# Patient Record
Sex: Male | Born: 1990 | Race: Black or African American | Hispanic: No | Marital: Single | Smoking: Never smoker
Health system: Southern US, Community
[De-identification: ages and names within clinical notes are randomized; demographics above are authoritative.]

## PROBLEM LIST (undated history)

## (undated) DIAGNOSIS — I4892 Unspecified atrial flutter: Secondary | ICD-10-CM

## (undated) HISTORY — DX: Unspecified atrial flutter: I48.92

---
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
## 2010-02-16 IMAGING — CR DG WRIST COMPLETE 3+V*R*
3 series · 3 of 3 positions shown · non-contrast
Comparison: None

CLINICAL DATA: Injured playing basketball

RIGHT WRIST - COMPLETE 3+ VIEW

[view not recorded (1 of 3)]
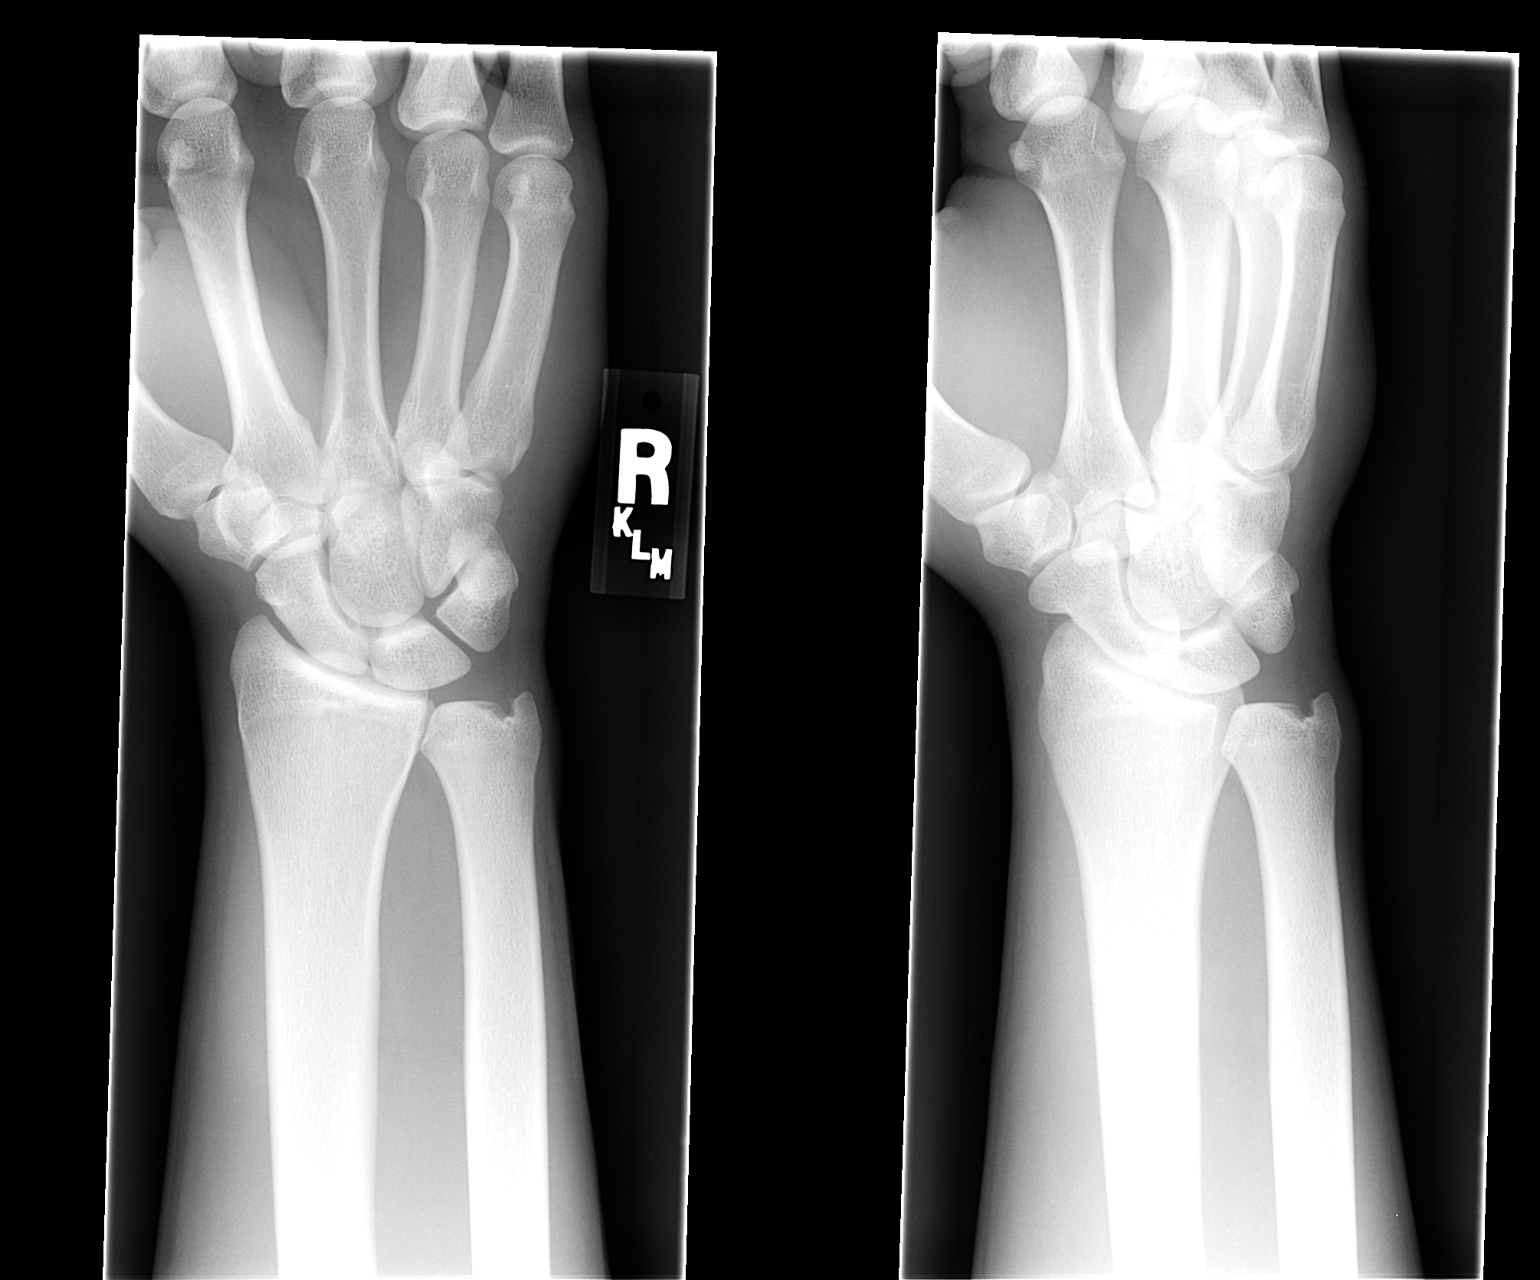

[view not recorded (2 of 3)]
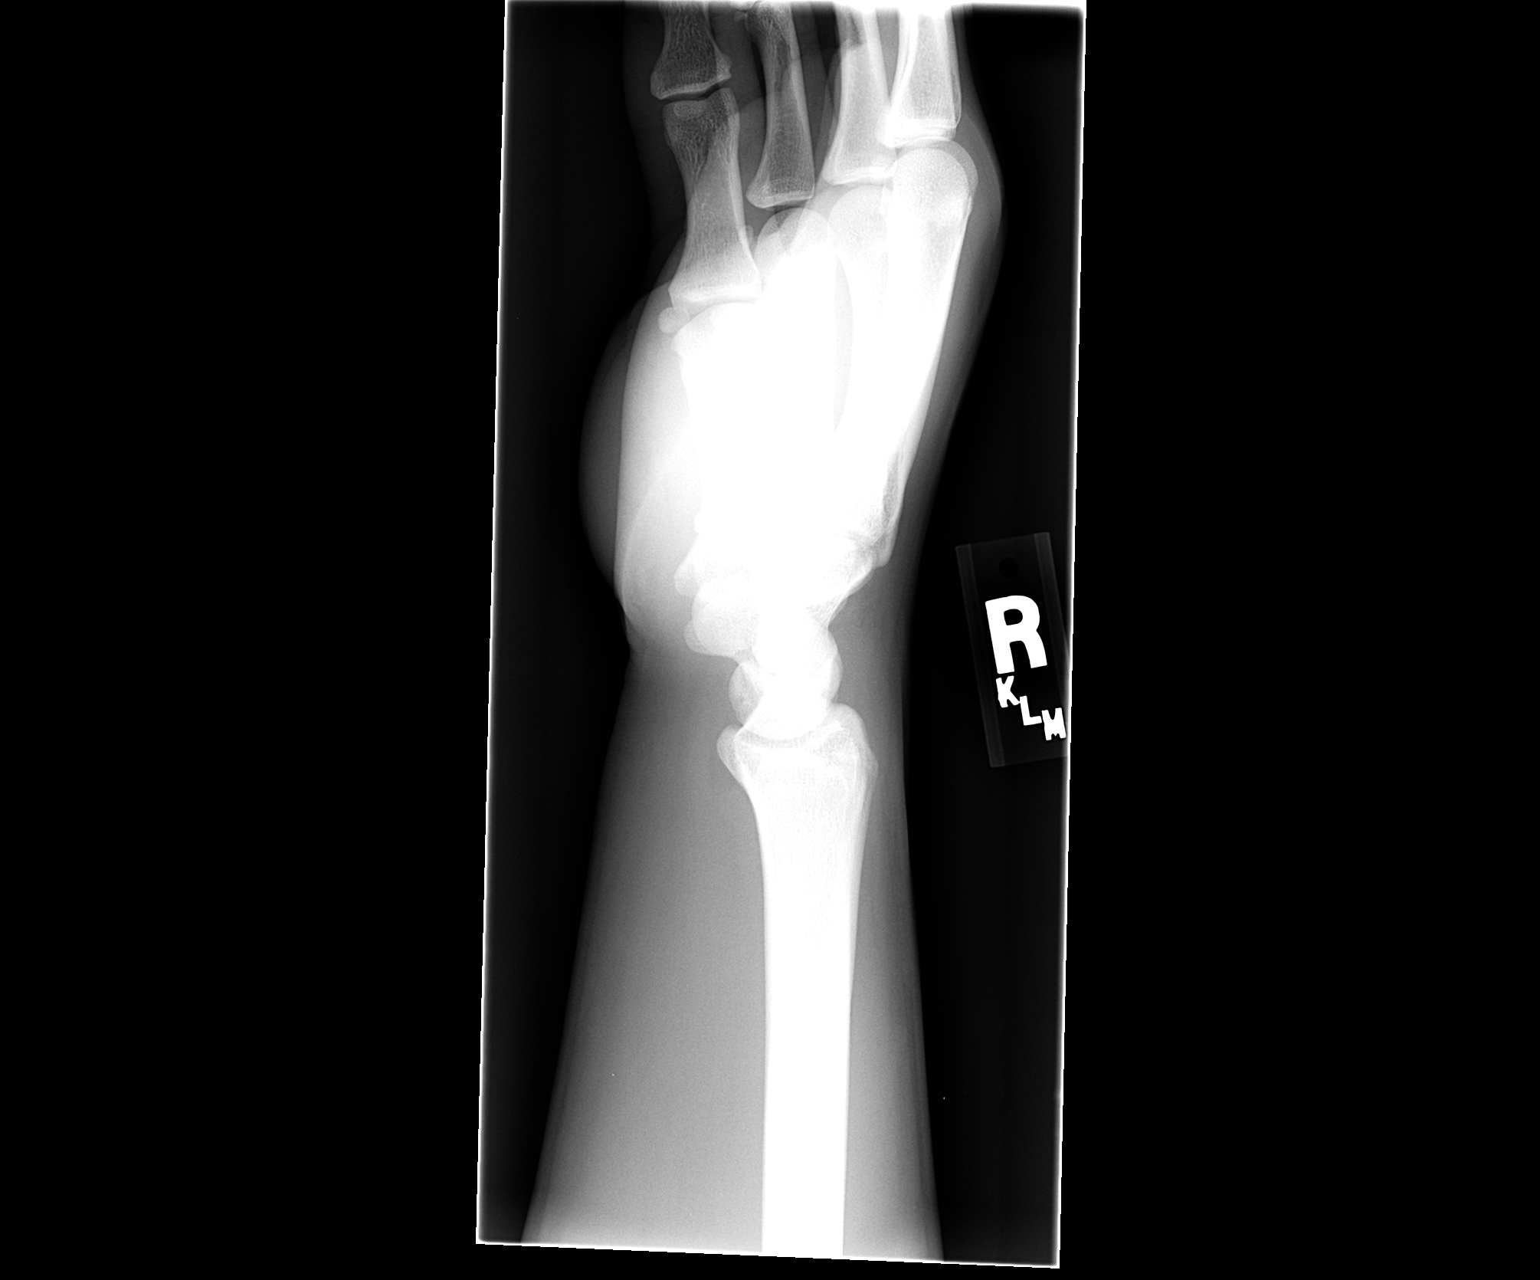

[view not recorded (3 of 3)]
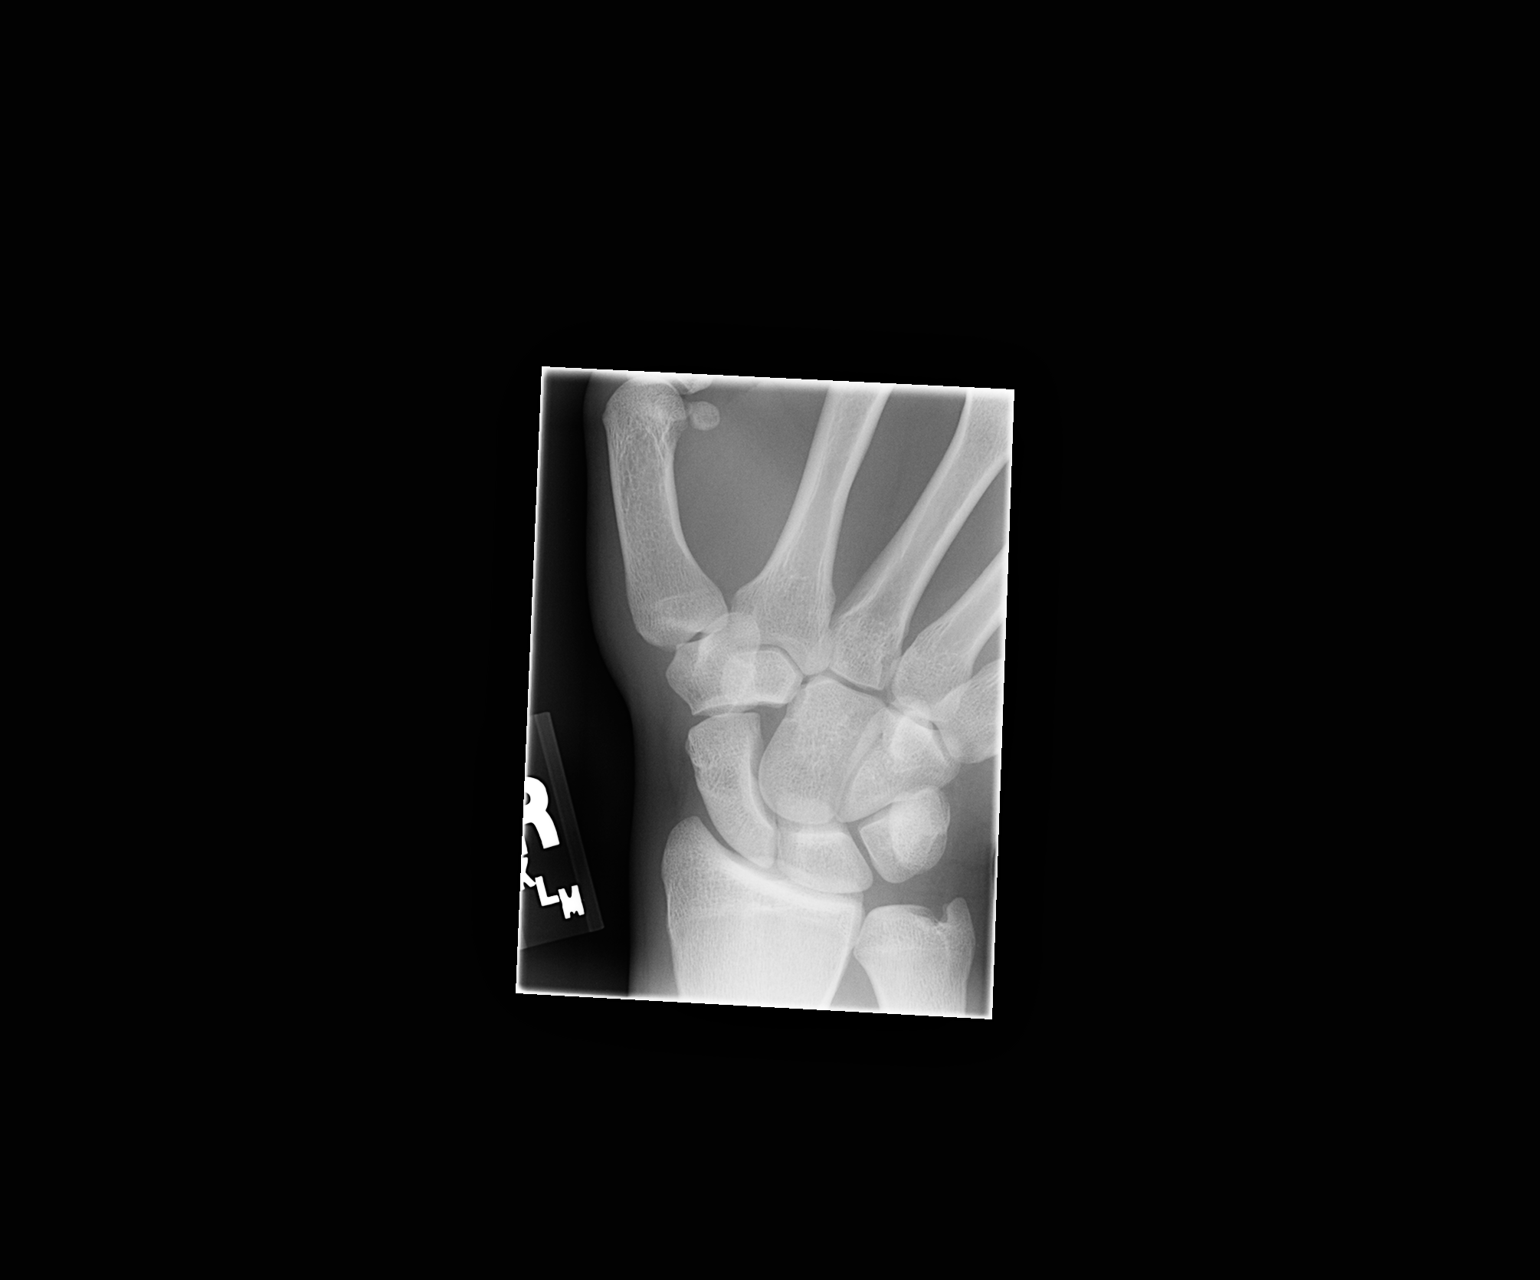

[3 of 3 positions shown; findings below may reference images not displayed]

FINDINGS: The radiocarpal joint space appears normal.  The carpal
bones are in normal position.  No acute bony abnormality is seen.
IMPRESSION: Negative right wrist.

## 2017-08-28 ENCOUNTER — Emergency Department (HOSPITAL_BASED_OUTPATIENT_CLINIC_OR_DEPARTMENT_OTHER)
Admission: EM | Admit: 2017-08-28 | Discharge: 2017-08-28 | Disposition: A | Discharge status: Home / Self Care | Payer: Self-pay | Attending: Emergency Medicine | Admitting: Emergency Medicine

## 2017-08-28 ENCOUNTER — Other Ambulatory Visit: Payer: Self-pay

## 2017-08-28 ENCOUNTER — Encounter (HOSPITAL_BASED_OUTPATIENT_CLINIC_OR_DEPARTMENT_OTHER): Payer: Self-pay | Admitting: *Deleted

## 2017-08-28 DIAGNOSIS — R05 Cough: Secondary | ICD-10-CM | POA: Insufficient documentation

## 2017-08-28 DIAGNOSIS — R509 Fever, unspecified: Secondary | ICD-10-CM | POA: Insufficient documentation

## 2017-08-28 DIAGNOSIS — R197 Diarrhea, unspecified: Secondary | ICD-10-CM | POA: Insufficient documentation

## 2017-08-28 DIAGNOSIS — R059 Cough, unspecified: Secondary | ICD-10-CM

## 2017-08-28 DIAGNOSIS — B349 Viral infection, unspecified: Secondary | ICD-10-CM | POA: Insufficient documentation

## 2017-08-28 DIAGNOSIS — R112 Nausea with vomiting, unspecified: Secondary | ICD-10-CM | POA: Insufficient documentation

## 2017-08-28 LAB — CBC
HEMATOCRIT: 49.7 % (ref 39.0–52.0)
HEMOGLOBIN: 17.2 g/dL — AB (ref 13.0–17.0)
MCH: 29.1 pg (ref 26.0–34.0)
MCHC: 34.6 g/dL (ref 30.0–36.0)
MCV: 84 fL (ref 78.0–100.0)
Platelets: 316 10*3/uL (ref 150–400)
RBC: 5.92 MIL/uL — ABNORMAL HIGH (ref 4.22–5.81)
RDW: 13 % (ref 11.5–15.5)
WBC: 8.1 10*3/uL (ref 4.0–10.5)

## 2017-08-28 LAB — URINALYSIS, ROUTINE W REFLEX MICROSCOPIC
BILIRUBIN URINE: NEGATIVE
Glucose, UA: NEGATIVE mg/dL
KETONES UR: 15 mg/dL — AB
LEUKOCYTES UA: NEGATIVE
NITRITE: NEGATIVE
PH: 6 (ref 5.0–8.0)
Protein, ur: NEGATIVE mg/dL
Specific Gravity, Urine: 1.025 (ref 1.005–1.030)

## 2017-08-28 LAB — COMPREHENSIVE METABOLIC PANEL
ALK PHOS: 99 U/L (ref 38–126)
ALT: 32 U/L (ref 17–63)
ANION GAP: 9 (ref 5–15)
AST: 29 U/L (ref 15–41)
Albumin: 4.6 g/dL (ref 3.5–5.0)
BILIRUBIN TOTAL: 0.9 mg/dL (ref 0.3–1.2)
BUN: 14 mg/dL (ref 6–20)
CALCIUM: 9.3 mg/dL (ref 8.9–10.3)
CO2: 24 mmol/L (ref 22–32)
Chloride: 99 mmol/L — ABNORMAL LOW (ref 101–111)
Creatinine, Ser: 1.34 mg/dL — ABNORMAL HIGH (ref 0.61–1.24)
GFR calc Af Amer: >60 mL/min (ref >60)
GLUCOSE: 101 mg/dL — AB (ref 65–99)
POTASSIUM: 3.5 mmol/L (ref 3.5–5.1)
Sodium: 132 mmol/L — ABNORMAL LOW (ref 135–145)
TOTAL PROTEIN: 8.5 g/dL — AB (ref 6.5–8.1)

## 2017-08-28 LAB — URINALYSIS, MICROSCOPIC (REFLEX): Squamous Epithelial / LPF: NONE SEEN

## 2017-08-28 LAB — LIPASE, BLOOD: Lipase: 22 U/L (ref 11–51)

## 2017-08-28 MED ORDER — SODIUM CHLORIDE 0.9 % IV BOLUS (SEPSIS)
1000.0000 mL | Freq: Once | INTRAVENOUS | Status: AC
  Administered 2017-08-28: 1000 mL via INTRAVENOUS

## 2017-08-28 MED ORDER — ONDANSETRON HCL 4 MG/2ML IJ SOLN
4.0000 mg | Freq: Once | INTRAMUSCULAR | Status: AC
  Administered 2017-08-28: 4 mg via INTRAVENOUS
  Filled 2017-08-28: qty 2

## 2017-08-28 MED ORDER — BENZONATATE 100 MG PO CAPS: 100.0000 mg | ORAL_CAPSULE | Freq: Three times a day (TID) | ORAL | 0 refills | Status: DC

## 2017-08-28 MED ORDER — ACETAMINOPHEN 500 MG PO TABS: 500.0000 mg | ORAL_TABLET | Freq: Four times a day (QID) | ORAL | 0 refills | Status: DC | PRN

## 2017-08-28 MED ORDER — ONDANSETRON 4 MG PO TBDP
4.0000 mg | ORAL_TABLET | Freq: Once | ORAL | Status: AC | PRN
  Administered 2017-08-28: 4 mg via ORAL

## 2017-08-28 MED ORDER — IBUPROFEN 600 MG PO TABS: 600.0000 mg | ORAL_TABLET | Freq: Four times a day (QID) | ORAL | 0 refills | Status: DC | PRN

## 2017-08-28 MED ORDER — ONDANSETRON HCL 4 MG PO TABS: 4.0000 mg | ORAL_TABLET | Freq: Four times a day (QID) | ORAL | 0 refills | Status: DC

## 2017-08-28 MED ORDER — ONDANSETRON 4 MG PO TBDP
ORAL_TABLET | ORAL | Status: AC
  Filled 2017-08-28: qty 1

## 2017-08-28 MED ORDER — ACETAMINOPHEN 325 MG PO TABS
650.0000 mg | ORAL_TABLET | Freq: Once | ORAL | Status: AC | PRN
  Administered 2017-08-28: 650 mg via ORAL
  Filled 2017-08-28: qty 2

## 2017-08-28 NOTE — Discharge Instructions (Signed)
Medications: Zofran, ibuprofen, Tylenol, Tessalon  Treatment: Take Zofran every 6 hours as needed for nausea or vomiting.  Take Tessalon every 8 hours as needed for cough.  Alternate ibuprofen and Tylenol as prescribed, as needed for fever or body aches.  Make sure to drink plenty of fluids.  Follow-up: Please return to the emergency department if you develop any new or worsening symptoms including intractable vomiting, localized abdominal pain, or difficulty breathing.  Please follow-up with a primary care provider for further evaluation of your kidney function once you are feeling better.  Your kidney function was decreased today, however this could be all related to your dehydration.  You can find a primary care provider by calling the number circled below.

## 2017-08-28 NOTE — ED Notes (Signed)
Pt given ginger ale and saltines per PA order.

## 2017-08-28 NOTE — ED Triage Notes (Signed)
Pt complains of fever, body aches, nausea, vomiting,body aches. started yesterday.

## 2017-08-28 NOTE — ED Notes (Signed)
Pt given d/c instructions as per chart. Rx x 4. Verbalizes understanding. No questions. 

## 2017-08-29 NOTE — ED Provider Notes (Signed)
[facility] EMERGENCY DEPARTMENT Provider Note   CSN: [id number] Arrival date & time: 08/28/17  1456     History   Chief Complaint Chief Complaint  Patient presents with  . Fever  . Nausea  . Emesis    HPI LEEVON Ruiz is a 27 y.o. male who presents with fever, body aches, nausea, vomiting, cough, and nasal congestion.  Patient has been not taking any medications at home for symptoms.  He has had a dry cough.  He denies any chest pain or shortness of breath.  He has not been able to keep any food or fluids down today.  He denies any abdominal pain, urinary symptoms.  No known sick contacts.  HPI  History reviewed. No pertinent past medical history.  There are no active problems to display for this patient.   History reviewed. No pertinent surgical history.     Home Medications    Prior to Admission medications   Medication Sig Start Date End Date Taking? Authorizing Provider  acetaminophen (TYLENOL) 500 MG tablet Take 1 tablet (500 mg total) by mouth every 6 (six) hours as needed. 08/28/17   Tylie Golonka, Waylan Boga, PA-C  benzonatate (TESSALON) 100 MG capsule Take 1 capsule (100 mg total) by mouth every 8 (eight) hours. 08/28/17   Nicky Milhouse, Waylan Boga, PA-C  ibuprofen (ADVIL,MOTRIN) 600 MG tablet Take 1 tablet (600 mg total) by mouth every 6 (six) hours as needed. 08/28/17   Kalisha Keadle, Waylan Boga, PA-C  ondansetron (ZOFRAN) 4 MG tablet Take 1 tablet (4 mg total) by mouth every 6 (six) hours. 08/28/17   Emi Holes, PA-C    Family History History reviewed. No pertinent family history.  Social History Social History   Tobacco Use  . Smoking status: Never Smoker  . Smokeless tobacco: Never Used  Substance Use Topics  . Alcohol use: Yes  . Drug use: Yes    Types: Marijuana     Allergies   Patient has no known allergies.   Review of Systems Review of Systems  Constitutional: Negative for chills and fever.  HENT: Positive for congestion and sore throat. Negative  for facial swelling.   Respiratory: Positive for cough. Negative for shortness of breath.   Cardiovascular: Negative for chest pain.  Gastrointestinal: Positive for nausea and vomiting. Negative for abdominal pain.  Genitourinary: Negative for dysuria.  Musculoskeletal: Positive for myalgias. Negative for back pain.  Skin: Negative for rash and wound.  Neurological: Positive for headaches.  Psychiatric/Behavioral: The patient is not nervous/anxious.      Physical Exam Updated Vital Signs BP 138/85   Pulse 89   Temp 99.6 F (37.6 C) (Oral)   Resp 16   SpO2 99%   Physical Exam  Constitutional: He appears well-developed and well-nourished. No distress.  HENT:  Head: Normocephalic and atraumatic.  Mouth/Throat: Oropharynx is clear and moist. No oropharyngeal exudate, posterior oropharyngeal edema, posterior oropharyngeal erythema or tonsillar abscesses.  Eyes: Conjunctivae are normal. Pupils are equal, round, and reactive to light. Right eye exhibits no discharge. Left eye exhibits no discharge. No scleral icterus.  Neck: Normal range of motion. Neck supple. No thyromegaly present.  Cardiovascular: Normal rate, regular rhythm, normal heart sounds and intact distal pulses. Exam reveals no gallop and no friction rub.  No murmur heard. Pulmonary/Chest: Effort normal and breath sounds normal. No stridor. No respiratory distress. He has no wheezes. He has no rales.  Abdominal: Soft. Bowel sounds are normal. He exhibits no distension. There is no tenderness.  There is no rebound and no guarding.  Musculoskeletal: He exhibits no edema.  Lymphadenopathy:    He has no cervical adenopathy.  Neurological: He is alert. Coordination normal.  Skin: Skin is warm and dry. No rash noted. He is not diaphoretic. No pallor.  Psychiatric: He has a normal mood and affect.  Nursing note and vitals reviewed.    ED Treatments / Results  Labs (all labs ordered are listed, but only abnormal results are  displayed) Labs Reviewed  COMPREHENSIVE METABOLIC PANEL - Abnormal; Notable for the following components:      Result Value   Sodium 132 (*)    Chloride 99 (*)    Glucose, Bld 101 (*)    Creatinine, Ser 1.34 (*)    Total Protein 8.5 (*)    All other components within normal limits  CBC - Abnormal; Notable for the following components:   RBC 5.92 (*)    Hemoglobin 17.2 (*)    All other components within normal limits  URINALYSIS, ROUTINE W REFLEX MICROSCOPIC - Abnormal; Notable for the following components:   Hgb urine dipstick TRACE (*)    Ketones, ur 15 (*)    All other components within normal limits  URINALYSIS, MICROSCOPIC (REFLEX) - Abnormal; Notable for the following components:   Bacteria, UA MANY (*)    All other components within normal limits  LIPASE, BLOOD    EKG  EKG Interpretation None       Radiology No results found.  Procedures Procedures (including critical care time)  Medications Ordered in ED Medications  acetaminophen (TYLENOL) tablet 650 mg (650 mg Oral Given 08/28/17 1545)  ondansetron (ZOFRAN-ODT) disintegrating tablet 4 mg (4 mg Oral Given 08/28/17 1545)  sodium chloride 0.9 % bolus 1,000 mL (0 mLs Intravenous Stopped 08/28/17 1959)  ondansetron (ZOFRAN) injection 4 mg (4 mg Intravenous Given 08/28/17 1856)     Initial Impression / Assessment and Plan / ED Course  I have reviewed the triage vital signs and the nursing notes.  Pertinent labs & imaging results that were available during my care of the patient were reviewed by me and considered in my medical decision making (see chart for details).     Patient with symptoms consistent with flu or other viral syndrome.  Labs show dehydration, hemoglobin 17.2, creatinine 1.34 (no previous to compare), 15 ketones in the urine.  Abdominal exam is benign, no focal tenderness.  Patient given IV fluids, Zofran, Tylenol in the ED and is feeling much better.  He is tolerating PO prior to discharge.  Will  discharge patient home with symptomatic treatment including Zofran, Tessalon, Tylenol, ibuprofen.  I discussed the cost versus benefits of Tamiflu and patient declines at this time.  Return precautions discussed.  Patient understands and agrees with plan.  Patient vitals stable and discharged in satisfactory condition.  Final Clinical Impressions(s) / ED Diagnoses   Final diagnoses:  Viral syndrome  Nausea vomiting and diarrhea  Cough    ED Discharge Orders        Ordered    ondansetron (ZOFRAN) 4 MG tablet  Every 6 hours     08/28/17 2015    acetaminophen (TYLENOL) 500 MG tablet  Every 6 hours PRN     08/28/17 2015    ibuprofen (ADVIL,MOTRIN) 600 MG tablet  Every 6 hours PRN     08/28/17 2015    benzonatate (TESSALON) 100 MG capsule  Every 8 hours     08/28/17 2015  Emi Holes, PA-C 08/29/17 0126    Tilden Fossa, MD 08/29/17 1438

## 2020-03-11 ENCOUNTER — Encounter (HOSPITAL_BASED_OUTPATIENT_CLINIC_OR_DEPARTMENT_OTHER): Payer: Self-pay | Admitting: *Deleted

## 2020-03-11 ENCOUNTER — Other Ambulatory Visit: Payer: Self-pay

## 2020-03-11 ENCOUNTER — Emergency Department (HOSPITAL_BASED_OUTPATIENT_CLINIC_OR_DEPARTMENT_OTHER)
Admission: EM | Admit: 2020-03-11 | Discharge: 2020-03-11 | Disposition: A | Discharge status: Home / Self Care | Payer: Self-pay | Attending: Emergency Medicine | Admitting: Emergency Medicine

## 2020-03-11 DIAGNOSIS — Z202 Contact with and (suspected) exposure to infections with a predominantly sexual mode of transmission: Secondary | ICD-10-CM | POA: Insufficient documentation

## 2020-03-11 LAB — URINALYSIS, MICROSCOPIC (REFLEX)

## 2020-03-11 LAB — URINALYSIS, ROUTINE W REFLEX MICROSCOPIC
Bilirubin Urine: NEGATIVE
Glucose, UA: NEGATIVE mg/dL
Hgb urine dipstick: NEGATIVE
Ketones, ur: NEGATIVE mg/dL
Nitrite: NEGATIVE
Protein, ur: NEGATIVE mg/dL
Specific Gravity, Urine: 1.025 (ref 1.005–1.030)
pH: 6 (ref 5.0–8.0)

## 2020-03-11 LAB — HIV ANTIBODY (ROUTINE TESTING W REFLEX): HIV Screen 4th Generation wRfx: NONREACTIVE

## 2020-03-11 MED ORDER — DOXYCYCLINE HYCLATE 100 MG PO TABS
100.0000 mg | ORAL_TABLET | Freq: Once | ORAL | Status: AC
  Administered 2020-03-11: 16:00:00 100 mg via ORAL
  Filled 2020-03-11: qty 1

## 2020-03-11 MED ORDER — LIDOCAINE HCL (PF) 1 % IJ SOLN
INTRAMUSCULAR | Status: AC
  Filled 2020-03-11: qty 5

## 2020-03-11 MED ORDER — CEFTRIAXONE SODIUM 500 MG IJ SOLR
500.0000 mg | Freq: Once | INTRAMUSCULAR | Status: AC
  Administered 2020-03-11: 16:00:00 500 mg via INTRAMUSCULAR
  Filled 2020-03-11: qty 500

## 2020-03-11 MED ORDER — DOXYCYCLINE HYCLATE 100 MG PO CAPS: 100.0000 mg | ORAL_CAPSULE | Freq: Two times a day (BID) | ORAL | 0 refills | Status: AC

## 2020-03-11 NOTE — ED Notes (Signed)
Medicated per orders, reviewed AVS with pt , provided copy of AVS as well, discussed safe sex practices with client and offered opportunity for questions, also further signs and symptoms to watch for. Instructed to get Rx filled and take as per ordered and to complete medication as ordered.

## 2020-03-11 NOTE — ED Notes (Signed)
States his male partner was recently dx with Chlamydia and is here for examination and testing. States he has no noted any signs and symptoms

## 2020-03-11 NOTE — Discharge Instructions (Addendum)
Please read the instructions below.  Take the antibiotic, as directed, until gone. Talk with your primary care provider about any new medications.  You have been treated today for gonorrhea and chlamydia. You will receive a call from the hospital if your test results come back positive. Avoid sexual activity until you know your test results. If your results come back positive, it is important that you inform all of your sexual partners.  Return to the ER for new or worsening symptoms.

## 2020-03-11 NOTE — ED Triage Notes (Signed)
C/o exposed to chlamydia, denies discharge

## 2020-03-11 NOTE — ED Provider Notes (Signed)
[facility] EMERGENCY DEPARTMENT Provider Note   CSN: [id number] Arrival date & time: 03/11/20  1329     History Chief Complaint  Patient presents with  . Exposure to STD    Donald Ruiz is a 29 y.o. male presenting to the ED for exposure to chlamydia.  His male partner is here in the ED for treatment of chlamydia after recent positive test result.  He states he has had no symptoms including no penile discharge, dysuria, testicular pain or swelling, abdominal pain, pain with bowel movements, fevers or chills.  The history is provided by the patient.       History reviewed. No pertinent past medical history.  There are no problems to display for this patient.   History reviewed. No pertinent surgical history.     History reviewed. No pertinent family history.  Social History   Tobacco Use  . Smoking status: Never Smoker  . Smokeless tobacco: Never Used  Substance Use Topics  . Alcohol use: Yes  . Drug use: Yes    Types: Marijuana    Home Medications Prior to Admission medications   Medication Sig Start Date End Date Taking? Authorizing Provider  acetaminophen (TYLENOL) 500 MG tablet Take 1 tablet (500 mg total) by mouth every 6 (six) hours as needed. 08/28/17   Law, Waylan Boga, PA-C  benzonatate (TESSALON) 100 MG capsule Take 1 capsule (100 mg total) by mouth every 8 (eight) hours. 08/28/17   Law, Waylan Boga, PA-C  doxycycline (VIBRAMYCIN) 100 MG capsule Take 1 capsule (100 mg total) by mouth 2 (two) times daily for 7 days. 03/11/20 03/18/20  Parag Dorton, [place] N, PA-C  ibuprofen (ADVIL,MOTRIN) 600 MG tablet Take 1 tablet (600 mg total) by mouth every 6 (six) hours as needed. 08/28/17   Law, Waylan Boga, PA-C  ondansetron (ZOFRAN) 4 MG tablet Take 1 tablet (4 mg total) by mouth every 6 (six) hours. 08/28/17   Emi Holes, PA-C    Allergies    Patient has no known allergies.  Review of Systems   Review of Systems  All other systems reviewed and are  negative.   Physical Exam Updated Vital Signs BP 121/80   Pulse 75   Temp 98.2 F (36.8 C) (Oral)   Resp 18   Ht 6\' 6"  (1.981 m)   Wt 117.9 kg   SpO2 100%   BMI 30.05 kg/m   Physical Exam Vitals and nursing note reviewed.  Constitutional:      Appearance: He is well-developed.  HENT:     Head: Normocephalic and atraumatic.  Eyes:     Conjunctiva/sclera: Conjunctivae normal.  Cardiovascular:     Rate and Rhythm: Normal rate and regular rhythm.  Pulmonary:     Effort: Pulmonary effort is normal.     Breath sounds: Normal breath sounds.  Abdominal:     General: Bowel sounds are normal.     Tenderness: There is no abdominal tenderness.  Genitourinary:    Comments: Patient declined Neurological:     Mental Status: He is alert.  Psychiatric:        Mood and Affect: Mood normal.        Behavior: Behavior normal.     ED Results / Procedures / Treatments   Labs (all labs ordered are listed, but only abnormal results are displayed) Labs Reviewed  URINALYSIS, ROUTINE W REFLEX MICROSCOPIC - Abnormal; Notable for the following components:      Result Value   Leukocytes,Ua TRACE (*)  All other components within normal limits  URINALYSIS, MICROSCOPIC (REFLEX) - Abnormal; Notable for the following components:   Bacteria, UA MANY (*)    All other components within normal limits  HIV ANTIBODY (ROUTINE TESTING W REFLEX)  RPR  GC/CHLAMYDIA PROBE AMP ([place]) NOT AT [facility]    EKG None  Radiology No results found.  Procedures Procedures (including critical care time)  Medications Ordered in ED Medications  lidocaine (PF) (XYLOCAINE) 1 % injection (has no administration in time range)  cefTRIAXone (ROCEPHIN) injection 500 mg (500 mg Intramuscular Given 03/11/20 1604)  doxycycline (VIBRA-TABS) tablet 100 mg (100 mg Oral Given 03/11/20 1604)    ED Course  I have reviewed the triage vital signs and the nursing notes.  Pertinent labs & imaging results that were  available during my care of the patient were reviewed by me and considered in my medical decision making (see chart for details).    MDM Rules/Calculators/A&P                          Patient presenting for chlamydia treatment after a male partner recently tested positive.  He denies any symptoms or complaints at this time.  Declined GU exam though abdomen is benign.  Vital signs are normal.  Urine with bacteria, patient without urinary symptoms.  GC chlamydia sent, HIV, RPR sent.  Patient is treated in the ED with Rocephin and doxycycline.  He is prescribed doxycycline for chlamydia treatment.  Instructed to follow-up with the health department.  Instructed to avoid sexual activity until he is completed treatment and had retesting.  Return precautions discussed.  Patient discharged.  Final Clinical Impression(s) / ED Diagnoses Final diagnoses:  Exposure to chlamydia    Rx / DC Orders ED Discharge Orders         Ordered    doxycycline (VIBRAMYCIN) 100 MG capsule  2 times daily     Discontinue  Reprint     03/11/20 1552           Braylee Lal, [place] N, PA-C 03/11/20 1612    Benjiman Core, MD 03/11/20 2340

## 2020-03-12 LAB — RPR: RPR Ser Ql: NONREACTIVE

## 2023-11-18 ENCOUNTER — Emergency Department (HOSPITAL_COMMUNITY): Payer: Self-pay

## 2023-11-18 ENCOUNTER — Encounter (HOSPITAL_COMMUNITY): Payer: Self-pay | Admitting: Emergency Medicine

## 2023-11-18 ENCOUNTER — Inpatient Hospital Stay (HOSPITAL_COMMUNITY)
Admission: EM | Admit: 2023-11-18 | Discharge: 2023-11-20 | DRG: 309 | Disposition: A | Discharge status: Home / Self Care | Payer: Self-pay | Attending: Family Medicine | Admitting: Family Medicine

## 2023-11-18 ENCOUNTER — Other Ambulatory Visit: Payer: Self-pay

## 2023-11-18 DIAGNOSIS — R072 Precordial pain: Principal | ICD-10-CM

## 2023-11-18 DIAGNOSIS — R079 Chest pain, unspecified: Secondary | ICD-10-CM | POA: Diagnosis present

## 2023-11-18 DIAGNOSIS — F1290 Cannabis use, unspecified, uncomplicated: Secondary | ICD-10-CM | POA: Diagnosis present

## 2023-11-18 DIAGNOSIS — Z79899 Other long term (current) drug therapy: Secondary | ICD-10-CM

## 2023-11-18 DIAGNOSIS — N179 Acute kidney failure, unspecified: Secondary | ICD-10-CM | POA: Diagnosis present

## 2023-11-18 DIAGNOSIS — R739 Hyperglycemia, unspecified: Secondary | ICD-10-CM | POA: Diagnosis present

## 2023-11-18 DIAGNOSIS — E860 Dehydration: Secondary | ICD-10-CM | POA: Diagnosis present

## 2023-11-18 DIAGNOSIS — R7309 Other abnormal glucose: Secondary | ICD-10-CM | POA: Insufficient documentation

## 2023-11-18 DIAGNOSIS — R7989 Other specified abnormal findings of blood chemistry: Secondary | ICD-10-CM | POA: Insufficient documentation

## 2023-11-18 DIAGNOSIS — I4892 Unspecified atrial flutter: Principal | ICD-10-CM

## 2023-11-18 LAB — RAPID URINE DRUG SCREEN, HOSP PERFORMED
Amphetamines: NOT DETECTED
Barbiturates: NOT DETECTED
Benzodiazepines: NOT DETECTED
Cocaine: NOT DETECTED
Opiates: NOT DETECTED
Tetrahydrocannabinol: POSITIVE — AB

## 2023-11-18 LAB — COMPREHENSIVE METABOLIC PANEL WITH GFR
ALT: 31 U/L (ref 0–44)
AST: 37 U/L (ref 15–41)
Albumin: 4.1 g/dL (ref 3.5–5.0)
Alkaline Phosphatase: 71 U/L (ref 38–126)
Anion gap: 15 (ref 5–15)
BUN: 17 mg/dL (ref 6–20)
CO2: 20 mmol/L — ABNORMAL LOW (ref 22–32)
Calcium: 9.2 mg/dL (ref 8.9–10.3)
Chloride: 102 mmol/L (ref 98–111)
Creatinine, Ser: 1.88 mg/dL — ABNORMAL HIGH (ref 0.61–1.24)
GFR, Estimated: 48 mL/min — ABNORMAL LOW (ref >60)
Glucose, Bld: 233 mg/dL — ABNORMAL HIGH (ref 70–99)
Potassium: 4 mmol/L (ref 3.5–5.1)
Sodium: 137 mmol/L (ref 135–145)
Total Bilirubin: 0.9 mg/dL (ref 0.0–1.2)
Total Protein: 7.5 g/dL (ref 6.5–8.1)

## 2023-11-18 LAB — CBC
HCT: 49 % (ref 39.0–52.0)
Hemoglobin: 16.3 g/dL (ref 13.0–17.0)
MCH: 28.9 pg (ref 26.0–34.0)
MCHC: 33.3 g/dL (ref 30.0–36.0)
MCV: 86.9 fL (ref 80.0–100.0)
Platelets: 407 K/uL — ABNORMAL HIGH (ref 150–400)
RBC: 5.64 MIL/uL (ref 4.22–5.81)
RDW: 13.1 % (ref 11.5–15.5)
WBC: 8.6 K/uL (ref 4.0–10.5)
nRBC: 0 % (ref 0.0–0.2)

## 2023-11-18 LAB — LIPID PANEL
Cholesterol: 134 mg/dL (ref 0–200)
HDL: 26 mg/dL — ABNORMAL LOW (ref >40)
LDL Cholesterol: 101 mg/dL — ABNORMAL HIGH (ref 0–99)
Total CHOL/HDL Ratio: 5.2 ratio
Triglycerides: 36 mg/dL (ref <150)
VLDL: 7 mg/dL (ref 0–40)

## 2023-11-18 LAB — HEMOGLOBIN A1C
Hgb A1c MFr Bld: 5.4 % (ref 4.8–5.6)
Mean Plasma Glucose: 108.28 mg/dL

## 2023-11-18 LAB — MAGNESIUM: Magnesium: 1.9 mg/dL (ref 1.7–2.4)

## 2023-11-18 LAB — TROPONIN I (HIGH SENSITIVITY)
Troponin I (High Sensitivity): 20 ng/L — ABNORMAL HIGH (ref <18)
Troponin I (High Sensitivity): 162 ng/L (ref <18)
Troponin I (High Sensitivity): 204 ng/L (ref <18)

## 2023-11-18 LAB — LIPASE, BLOOD: Lipase: 31 U/L (ref 11–51)

## 2023-11-18 LAB — BRAIN NATRIURETIC PEPTIDE: B Natriuretic Peptide: 51.9 pg/mL (ref 0.0–100.0)

## 2023-11-18 LAB — TSH: TSH: 3.475 u[IU]/mL (ref 0.350–4.500)

## 2023-11-18 LAB — HIV ANTIBODY (ROUTINE TESTING W REFLEX): HIV Screen 4th Generation wRfx: NONREACTIVE

## 2023-11-18 MED ORDER — FENTANYL CITRATE PF 50 MCG/ML IJ SOSY
50.0000 ug | PREFILLED_SYRINGE | Freq: Once | INTRAMUSCULAR | Status: AC
  Administered 2023-11-18: 50 ug via INTRAVENOUS
  Filled 2023-11-18: qty 1

## 2023-11-18 MED ORDER — SODIUM CHLORIDE 0.9 % IV BOLUS
1000.0000 mL | Freq: Once | INTRAVENOUS | Status: AC
  Administered 2023-11-18: 1000 mL via INTRAVENOUS

## 2023-11-18 MED ORDER — HEPARIN (PORCINE) 25000 UT/250ML-% IV SOLN
1400.0000 [IU]/h | INTRAVENOUS | Status: AC
  Administered 2023-11-18: 1400 [IU]/h via INTRAVENOUS
  Filled 2023-11-18: qty 250

## 2023-11-18 MED ORDER — POLYETHYLENE GLYCOL 3350 17 G PO PACK: 17.0000 g | PACK | Freq: Every day | ORAL | Status: DC | PRN

## 2023-11-18 MED ORDER — APIXABAN 5 MG PO TABS
5.0000 mg | ORAL_TABLET | Freq: Two times a day (BID) | ORAL | Status: DC
  Administered 2023-11-19 – 2023-11-20 (×3): 5 mg via ORAL
  Filled 2023-11-18 (×3): qty 1

## 2023-11-18 MED ORDER — METOPROLOL TARTRATE 5 MG/5ML IV SOLN
5.0000 mg | Freq: Once | INTRAVENOUS | Status: AC
  Administered 2023-11-18: 5 mg via INTRAVENOUS
  Filled 2023-11-18: qty 5

## 2023-11-18 MED ORDER — INSULIN ASPART 100 UNIT/ML IJ SOLN: 0.0000 [IU] | Freq: Three times a day (TID) | INTRAMUSCULAR | Status: DC

## 2023-11-18 MED ORDER — SODIUM CHLORIDE 0.9% FLUSH
3.0000 mL | Freq: Two times a day (BID) | INTRAVENOUS | Status: DC
  Administered 2023-11-18 – 2023-11-20 (×4): 3 mL via INTRAVENOUS

## 2023-11-18 MED ORDER — IOHEXOL 350 MG/ML SOLN
75.0000 mL | Freq: Once | INTRAVENOUS | Status: AC | PRN
  Administered 2023-11-18: 75 mL via INTRAVENOUS

## 2023-11-18 MED ORDER — ONDANSETRON HCL 4 MG/2ML IJ SOLN
4.0000 mg | Freq: Once | INTRAMUSCULAR | Status: AC
  Administered 2023-11-18: 4 mg via INTRAVENOUS
  Filled 2023-11-18: qty 2

## 2023-11-18 MED ORDER — ASPIRIN 81 MG PO CHEW
324.0000 mg | CHEWABLE_TABLET | Freq: Once | ORAL | Status: AC
  Administered 2023-11-18: 324 mg via ORAL
  Filled 2023-11-18: qty 4

## 2023-11-18 MED ORDER — HEPARIN BOLUS VIA INFUSION
4000.0000 [IU] | Freq: Once | INTRAVENOUS | Status: AC
  Administered 2023-11-18: 4000 [IU] via INTRAVENOUS
  Filled 2023-11-18: qty 4000

## 2023-11-18 MED ORDER — METOPROLOL TARTRATE 25 MG PO TABS
25.0000 mg | ORAL_TABLET | Freq: Two times a day (BID) | ORAL | Status: DC
  Administered 2023-11-18 – 2023-11-19 (×2): 25 mg via ORAL
  Filled 2023-11-18 (×2): qty 1

## 2023-11-18 MED ORDER — APIXABAN 5 MG PO TABS
10.0000 mg | ORAL_TABLET | Freq: Once | ORAL | Status: AC
  Administered 2023-11-18: 10 mg via ORAL
  Filled 2023-11-18: qty 2

## 2023-11-18 MED ORDER — ACETAMINOPHEN 325 MG PO TABS: 650.0000 mg | ORAL_TABLET | Freq: Four times a day (QID) | ORAL | Status: DC | PRN

## 2023-11-18 MED ORDER — ACETAMINOPHEN 650 MG RE SUPP: 650.0000 mg | Freq: Four times a day (QID) | RECTAL | Status: DC | PRN

## 2023-11-18 NOTE — H&P (Signed)
 History and Physical   Donald Ruiz DGU:[id number] DOB: 1991/03/05 DOA: 11/18/2023  PCP: Pricilla Riffle, MD   Patient coming from: Home  Chief Complaint: Chest Pain  HPI: Donald Ruiz is a 33 y.o. male with no known significant past medical history presenting with chest pain.  Patient was sitting watching TV and when he got up he began to experience lower chest and epigastric pain.  He noted to have significant shortness of breath as well.  Also reports associated lightheadedness and diaphoresis.  States he has had some prior less severe very brief episodes in the past but none that were as severe as this.   Denies fevers, chills, constipation, diarrhea, nausea, vomiting.  ED Course: Vital signs in the ED notable for heart rate in the 60s to 130s, blood pressure in the 100s to 110s systolic.  Lab workup included CMP with bicarb 28, creatinine of 1.8 last labs showed creatinine of 1.36 years ago, glucose newly elevated to 232.  CBC with platelets 4 7.  Troponin trend 20, 162.  Lipase normal.  BNP normal.  TSH normal.  UDS pending.  Chest x-ray without acute abnormality.  CTA PE study without acute abnormality.  CT of the abdomen pelvis without acute abnormality.  Patient received aspirin, fentanyl, metoprolol, Zofran, 1 L IV fluids and started on heparin infusion in the ED.  Cardiology consulted and will see the patient.  Asked for hospitalist to admit due to newly elevated glucose and creatinine.  Review of Systems: As per HPI otherwise all other systems reviewed and are negative.  History reviewed. No pertinent past medical history.  History reviewed. No pertinent surgical history.  Social History  reports that he has never smoked. He has never used smokeless tobacco. He reports current alcohol use. He reports current drug use. Drug: Marijuana.  No Known Allergies  History reviewed. No pertinent family history.  Prior to Admission medications   Medication Sig Start Date End  Date Taking? Authorizing Provider  acetaminophen (TYLENOL) 500 MG tablet Take 1 tablet (500 mg total) by mouth every 6 (six) hours as needed. 08/28/17   Law, Waylan Boga, PA-C  benzonatate (TESSALON) 100 MG capsule Take 1 capsule (100 mg total) by mouth every 8 (eight) hours. 08/28/17   Law, Waylan Boga, PA-C  ibuprofen (ADVIL,MOTRIN) 600 MG tablet Take 1 tablet (600 mg total) by mouth every 6 (six) hours as needed. 08/28/17   Law, Waylan Boga, PA-C  ondansetron (ZOFRAN) 4 MG tablet Take 1 tablet (4 mg total) by mouth every 6 (six) hours. 08/28/17   Emi Holes, PA-C    Physical Exam: Vitals:   11/18/23 1430 11/18/23 1515 11/18/23 1630 11/18/23 1659  BP: (!) 122/91 (!) 113/96 104/69   Pulse: (!) 58 (!) 110 65   Resp: 18 13 10    Temp:    (!) 97.5 F (36.4 C)  TempSrc:    Oral  SpO2: 100% 99% 100%   Weight:      Height:        Physical Exam Constitutional:      General: He is not in acute distress.    Appearance: Normal appearance.     Comments: Muscular build  HENT:     Head: Normocephalic and atraumatic.     Mouth/Throat:     Mouth: Mucous membranes are moist.     Pharynx: Oropharynx is clear.  Eyes:     Extraocular Movements: Extraocular movements intact.     Pupils: Pupils are equal, round,  and reactive to light.  Cardiovascular:     Rate and Rhythm: Regular rhythm. Tachycardia present.     Pulses: Normal pulses.     Heart sounds: Normal heart sounds.  Pulmonary:     Effort: Pulmonary effort is normal. No respiratory distress.     Breath sounds: Normal breath sounds.  Abdominal:     General: Bowel sounds are normal. There is no distension.     Palpations: Abdomen is soft.     Tenderness: There is no abdominal tenderness.  Musculoskeletal:        General: No swelling or deformity.  Skin:    General: Skin is warm and dry.  Neurological:     General: No focal deficit present.     Mental Status: Mental status is at baseline.    Labs on Admission: I have personally  reviewed following labs and imaging studies  CBC: Recent Labs  Lab 11/18/23 1311  WBC 8.6  HGB 16.3  HCT 49.0  MCV 86.9  PLT 407*    Basic Metabolic Panel: Recent Labs  Lab 11/18/23 1311  NA 137  K 4.0  CL 102  CO2 20*  GLUCOSE 233*  BUN 17  CREATININE 1.88*  CALCIUM 9.2    GFR: Estimated Creatinine Clearance: 84.3 mL/min (A) (by C-G formula based on SCr of 1.88 mg/dL (H)).  Liver Function Tests: Recent Labs  Lab 11/18/23 1311  AST 37  ALT 31  ALKPHOS 71  BILITOT 0.9  PROT 7.5  ALBUMIN 4.1    Urine analysis:    Component Value Date/Time   COLORURINE YELLOW 03/11/2020 1350   APPEARANCEUR CLEAR 03/11/2020 1350   LABSPEC 1.025 03/11/2020 1350   PHURINE 6.0 03/11/2020 1350   GLUCOSEU NEGATIVE 03/11/2020 1350   HGBUR NEGATIVE 03/11/2020 1350   BILIRUBINUR NEGATIVE 03/11/2020 1350   KETONESUR NEGATIVE 03/11/2020 1350   PROTEINUR NEGATIVE 03/11/2020 1350   NITRITE NEGATIVE 03/11/2020 1350   LEUKOCYTESUR TRACE (A) 03/11/2020 1350    Radiological Exams on Admission: CT Angio Chest PE W and/or Wo Contrast Result Date: 11/18/2023 CLINICAL DATA:  Pulmonary embolism (PE) suspected, high prob acute onset CP, SOB EXAM: CT ANGIOGRAPHY CHEST WITH CONTRAST TECHNIQUE: Multidetector CT imaging of the chest was performed using the standard protocol during bolus administration of intravenous contrast. Multiplanar CT image reconstructions and MIPs were obtained to evaluate the vascular anatomy. RADIATION DOSE REDUCTION: This exam was performed according to the departmental dose-optimization program which includes automated exposure control, adjustment of the mA and/or kV according to patient size and/or use of iterative reconstruction technique. CONTRAST:  75mL OMNIPAQUE IOHEXOL 350 MG/ML SOLN COMPARISON:  None Available. FINDINGS: Cardiovascular: Satisfactory opacification of the pulmonary arteries to the segmental level. No evidence of pulmonary embolism. Thoracic aorta is  normal in course and caliber. Normal heart size. No pericardial effusion. Mediastinum/Nodes: No enlarged mediastinal, hilar, or axillary lymph nodes. Thyroid gland, trachea, and esophagus demonstrate no significant findings. Lungs/Pleura: Lungs are clear. No pleural effusion or pneumothorax. Upper Abdomen: See dedicated CT abdomen pelvis. Musculoskeletal: No acute osseous abnormality. Bilateral gynecomastia. Review of the MIP images confirms the above findings. IMPRESSION: No evidence of pulmonary embolism or other acute intrathoracic findings. Electronically Signed   By: Duanne Guess D.O.   On: 11/18/2023 17:00   CT ABDOMEN PELVIS W CONTRAST Result Date: 11/18/2023 CLINICAL DATA:  Abdominal pain, acute, nonlocalized acute onset severe epigastric pain EXAM: CT ABDOMEN AND PELVIS WITH CONTRAST TECHNIQUE: Multidetector CT imaging of the abdomen and pelvis was performed using  the standard protocol following bolus administration of intravenous contrast. RADIATION DOSE REDUCTION: This exam was performed according to the departmental dose-optimization program which includes automated exposure control, adjustment of the mA and/or kV according to patient size and/or use of iterative reconstruction technique. CONTRAST:  75mL OMNIPAQUE IOHEXOL 350 MG/ML SOLN COMPARISON:  None Available. FINDINGS: Lower chest: See dedicated CT chest report. Hepatobiliary: No focal liver abnormality is seen. No gallstones, gallbladder wall thickening, or biliary dilatation. Pancreas: Unremarkable. No pancreatic ductal dilatation or surrounding inflammatory changes. Spleen: Normal in size without focal abnormality. Adrenals/Urinary Tract: Adrenal glands are unremarkable. Kidneys are normal, without renal calculi, focal lesion, or hydronephrosis. Bladder is unremarkable. Stomach/Bowel: Stomach is within normal limits. Appendix appears normal. No evidence of bowel wall thickening, distention, or inflammatory changes. Vascular/Lymphatic: No  significant vascular findings are present. No enlarged abdominal or pelvic lymph nodes. Reproductive: Prostate is unremarkable. Other: No free air or free fluid. Musculoskeletal: No acute or significant osseous findings. 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 IMPRESSION: 1. No acute abdominopelvic findings. 2. 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 Electronically Signed   By: Duanne Guess D.O.   On: 11/18/2023 16:56   DG Chest Portable 1 View Result Date: 11/18/2023 CLINICAL DATA:  Chest pain and dizziness. EXAM: PORTABLE CHEST 1 VIEW COMPARISON:  None Available. FINDINGS: The heart size and mediastinal contours are within normal limits. No focal consolidation, pleural effusion, or pneumothorax. No acute osseous abnormality. IMPRESSION: No acute cardiopulmonary findings. Electronically Signed   By: Hart Robinsons M.D.   On: 11/18/2023 15:16   EKG: Independently reviewed.  Atrial flutter with rapid ventricular response at 139 bpm.  Nonspecific T wave changes.  Assessment/Plan Principal Problem:   Chest pain, rule out acute myocardial infarction Active Problems:   Atrial flutter with rapid ventricular response (HCC)   Elevated serum creatinine   Elevated glucose   Chest pain > Rule out NSTEMI > Patient with chest pain, shortness of breath, lightheadedness, diaphoresis.  Concern for possible NSTEMI.  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 > Troponin trend 20, 162.  Story suspicious with chest pain, shortness of breath, lightheadedness, diaphoresis. > Is in new a flutter with RVR as below.  This may explain chest pain, shortness of breath, troponin elevation. > Cardiology consulted in the ED and are seeing the patient.  Patient has received aspirin, fentanyl, metoprolol, Zofran, heparin infusion. - Monitor on progressive unit overnight - Appreciate  cardiology recommendations and assistance - Continue with heparin infusion - Echocardiogram - Trend troponin - Supportive care  New onset atrial flutter with RVR > Atrial flutter at 139 bpm in ED.  This could explain his chest pain and shortness of breath and could have strain leading to his troponin elevation. > Some response to IV metoprolol in the ED, will await cardiology recommendations for next intervention. - Appreciate cardiology recommendations and assistance - Monitoring on telemetry - Echocardiogram - Medication management per cardiology - Cardiology may attempt TEE tomorrow  Elevated creatinine > Incidental creatinine elevation to 1.8.  Last creatinine was 1.36 years ago.  Patient is muscular this could be due to his muscle mass but will trend. > Already received a liter of fluids. - Trend renal function and  electrolytes  Elevated CBG > No history of this, no history of diabetes.  Will check A1c. - A1c - SSI - CBGs  DVT  prophylaxis: Heparin Code Status:   Full Family Communication:  Fianc on phone during interview Disposition Plan:   Patient is from:  Home  Anticipated DC to:  Home  Anticipated DC date:  1 to 3 days  Anticipated DC barriers: None  Consults called:  Cardiology Admission status:  Observation, progressive  Severity of Illness: The appropriate patient status for this patient is OBSERVATION. Observation status is judged to be reasonable and necessary in order to provide the required intensity of service to ensure the patient's safety. The patient's presenting symptoms, physical exam findings, and initial radiographic and laboratory data in the context of their medical condition is felt to place them at decreased risk for further clinical deterioration. Furthermore, it is anticipated that the patient will be medically stable for discharge from the hospital within 2 midnights of admission.    Synetta Fail MD [facility]  How to contact  the [facility] Attending or Consulting provider 7A - 7P or covering provider during after hours 7P -7A, for this patient?   Check the care team in [facility] and look for a) attending/consulting [facility] provider listed and b) the [facility] team listed Log into [email or web address] and use [place] universal password to access. If you do not have the password, please contact the hospital operator. Locate the [facility] provider you are looking for under [facility] and page to a number that you can be directly reached. If you still have difficulty reaching the provider, please page the [facility] (Director on Call) for the Hospitalists listed on amion for assistance.  11/18/2023, 6:46 PM

## 2023-11-18 NOTE — ED Notes (Signed)
 Critical Troponin 162. ND Long notified.

## 2023-11-18 NOTE — ED Provider Notes (Signed)
  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 Radiology: ordered.  Risk OTC drugs. Prescription drug management. Decision regarding hospitalization.   Please see prior ED providers note for HPI and physical exam.  At the time of handoff, I was following up labs and CT imaging.  On my reevaluation after signout, patient endorses some improvement in his chest pain.  Patient's rate still noted to be approximately 110 in Aflutter on the monitor in the room.  Labs are initially notable for an elevated glucose of 233; patient has no history of diabetes.  Additionally, creatinine is elevated at 1.88.  BNP appropriate at 52, TSH appropriate 3.475.  Lipase WNL.  CBC notable for no leukocytosis, stable hemoglobin.  Initial troponin 20; 2-hour follow-up troponin 162.  CXR, which I reviewed, there is no focal consolidation concerning for pneumonia, no pleural effusion, no pneumothorax.  CTA chest with no evidence of pulmonary embolism or acute intrathoracic abnormalities.  CT abdomen and pelvis notable for no intra-abdominal abnormalities, including no gallstones, no formation of the pancreas, no hydronephrosis, no evidence of bowel wall thickening or inflammatory changes.  With elevated troponin, initiated patient on heparin for concern for ACS.  Given elevated troponin with acute onset chest pain and new atrial flutter, consult cardiology.  Dr. Tenny Craw will see and evaluate the patient and request  internal med admission given newly elevated glucose and renal insufficiency.  Hospitalist contacted for patient admission. Patient was discussed with Dr. Alinda Money who agreed to admit the patient.  Patient required no further emergent intervention prior to hospitalist assuming patient care.  Renella Cunas, PGY2 Emergency Medicine   Renella Cunas, MD 11/18/23 Wynona Meals, MD 11/25/23 1538

## 2023-11-18 NOTE — Progress Notes (Signed)
 ANTICOAGULATION CONSULT NOTE  Pharmacy Consult for Heparin Indication: chest pain/ACS  No Known Allergies  Patient Measurements: Height: 6\' 6"  (198.1 cm) Weight: 127 kg (280 lb) IBW/kg (Calculated) : 91.4 Heparin Dosing Weight: 118.1  Vital Signs: Temp: 97.5 F (36.4 C) (03/27 1659) Temp Source: Oral (03/27 1659) BP: 104/69 (03/27 1630) Pulse Rate: 65 (03/27 1630)  Labs: Recent Labs    11/18/23 1311 11/18/23 1629  HGB 16.3  --   HCT 49.0  --   PLT 407*  --   CREATININE 1.88*  --   TROPONINIHS 20* 162*    Estimated Creatinine Clearance: 84.3 mL/min (A) (by C-G formula based on SCr of 1.88 mg/dL (H)).   Medical History: History reviewed. No pertinent past medical history.  Medications:  (Not in a hospital admission)  Scheduled:  Infusions:  PRN:   Assessment: 32 yom presenting with severe chest pain. Heparin per pharmacy consult placed for chest pain/ACS.  CTA w/out evidence of PE  Patient is not on anticoagulation prior to arrival.  Hgb 16.3; plt 407 hsTrop 20>162  Goal of Therapy:  Heparin level 0.3-0.7 units/ml Monitor platelets by anticoagulation protocol: Yes   Plan:  Give IV heparin 4000 units bolus x 1 Start heparin infusion at 1400 units/hr Check anti-Xa level in 6 hours and daily while on heparin Continue to monitor H&H and platelets  Delmar Landau, PharmD, BCPS 11/18/2023 6:22 PM ED Clinical Pharmacist -  [phone]

## 2023-11-18 NOTE — Consult Note (Addendum)
 Cardiology Consultation   Patient ID: Donald Ruiz MRN: [id number]; DOB: 06-Feb-1991  Admit date: 11/18/2023 Date of Consult: 11/18/2023  PCP:  Pricilla Riffle, MD   [facility] Providers Cardiologist:  Abbey Chatters, MD        Patient Profile:   Donald Ruiz is a 33 y.o. male with no prior cardiac history who is being seen 11/18/2023 for the evaluation of new onset atrial flutter and chest discomfort at the request of Dr. Jacqulyn Bath.  History of Present Illness:   Mr. Bowlby has no prior cardiac history.  He presented to [facility] ED today 11/18/2023 with chest pain x 1 hour, diaphoresis, and nausea.  He was watching TV and then stood up to go to the restroom and became very short of breath.  He reports lightheadedness and diaphoresis shortly thereafter developed chest discomfort in the upper epigastric region.  EKG obtained showed atrial flutter with RVR in the 140s.  He was treated with IV metoprolol 5 mg with improvement in his rates to 100s.  He also received 324 mg aspirin and fentanyl.  HS troponin 20 --> 162 BNP 51.9 TSH 3.475 sCr 1.88 PLT 407  Cardiology consulted for newly recognized atrial flutter in the 140 range. He was given 5 mg IV lopressor with improved heart rates in the 100s.   He describes a history of intermittent chest discomfort that has been happening 2-3 times per week for the last year. CP was brief in duration and occurred with rest and exertion. He describes CP as a pressure like something sitting on his chest. After rest, CP subsides after about 5 minutes.   Today, he got up to get ready for work and experience similar but more intense chest pain associated with SOB, diaphoresis, nausea and vomiting. Currently chest pain free and feeling improved.  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 He started this job three weeks ago and has noted that the CP has worsened in the last  three weeks. He does not smoke cigarettes or drink alcohol. He uses THC nearly daily, but no illicit drugs.     History reviewed. No pertinent past medical history.  History reviewed. No pertinent surgical history.   Home Medications:  Prior to Admission medications   Medication Sig Start Date End Date Taking? Authorizing Provider  acetaminophen (TYLENOL) 500 MG tablet Take 1 tablet (500 mg total) by mouth every 6 (six) hours as needed. 08/28/17   Law, Waylan Boga, PA-C  benzonatate (TESSALON) 100 MG capsule Take 1 capsule (100 mg total) by mouth every 8 (eight) hours. 08/28/17   Law, Waylan Boga, PA-C  ibuprofen (ADVIL,MOTRIN) 600 MG tablet Take 1 tablet (600 mg total) by mouth every 6 (six) hours as needed. 08/28/17   Law, Waylan Boga, PA-C  ondansetron (ZOFRAN) 4 MG tablet Take 1 tablet (4 mg total) by mouth every 6 (six) hours. 08/28/17   Emi Holes, PA-C    Inpatient Medications: Scheduled Meds:  [START ON 11/19/2023] insulin aspart  0-9 Units Subcutaneous TID WC   sodium chloride flush  3 mL Intravenous Q12H   Continuous Infusions:  heparin 1,400 Units/hr (11/18/23 1832)   PRN Meds: acetaminophen **OR** acetaminophen, polyethylene glycol  Allergies:   No Known Allergies  Social History:   Social History   Socioeconomic History   Marital status: Single    Spouse name: Not on  file   Number of children: Not on file   Years of education: Not on file   Highest education level: Not on file  Occupational History   Not on file  Tobacco Use   Smoking status: Never   Smokeless tobacco: Never  Substance and Sexual Activity   Alcohol use: Yes   Drug use: Yes    Types: Marijuana   Sexual activity: Yes  Other Topics Concern   Not on file  Social History Narrative   Not on file   Social Drivers of Health   Financial Resource Strain: Not on file  Food Insecurity: Not on file  Transportation Needs: Not on file  Physical Activity: Not on file  Stress: Not on file  Social  Connections: Not on file  Intimate Partner Violence: Not on file    Family History:   History reviewed. No pertinent family history.   ROS:  Please see the history of present illness.   All other ROS reviewed and negative.     Physical Exam/Data:   Vitals:   11/18/23 1430 11/18/23 1515 11/18/23 1630 11/18/23 1659  BP: (!) 122/91 (!) 113/96 104/69   Pulse: (!) 58 (!) 110 65   Resp: 18 13 10    Temp:    (!) 97.5 F (36.4 C)  TempSrc:    Oral  SpO2: 100% 99% 100%   Weight:      Height:       No intake or output data in the 24 hours ending 11/18/23 1847    11/18/2023   12:55 PM 03/11/2020    1:51 PM  Last 3 Weights  Weight (lbs) 280 lb 260 lb  Weight (kg) 127.007 kg 117.935 kg     Body mass index is 32.36 kg/m.  General:  Obese 33 yo  in no acute distress HEENT: normal Neck: no JVD Vascular: No carotid bruits; Distal pulses 2+ bilaterally Cardiac:  irregular rhythm tachycardic rate Lungs:  clear to auscultation bilaterally, no wheezing, rhonchi or rales  Abd: soft, nontender, no hepatomegaly  Ext: no edema Musculoskeletal:  No deformities, BUE and BLE strength normal and equal Skin: warm and dry  Neuro:  CNs 2-12 intact, no focal abnormalities noted Psych:  Normal affect   EKG:  The EKG was personally reviewed and demonstrates:  atrial flutter with ventricular rate  139 bpm  Telemetry:  Telemetry was personally reviewed and demonstrates:  atrial flutter with VR 70-100s  Relevant CV Studies:  Echo pending  Laboratory Data:  High Sensitivity Troponin:   Recent Labs  Lab 11/18/23 1311 11/18/23 1629  TROPONINIHS 20* 162*     Chemistry Recent Labs  Lab 11/18/23 1311  NA 137  K 4.0  CL 102  CO2 20*  GLUCOSE 233*  BUN 17  CREATININE 1.88*  CALCIUM 9.2  GFRNONAA 48*  ANIONGAP 15    Recent Labs  Lab 11/18/23 1311  PROT 7.5  ALBUMIN 4.1  AST 37  ALT 31  ALKPHOS 71  BILITOT 0.9   Lipids No results for input(s): "CHOL", "TRIG", "HDL", "LABVLDL",  "LDLCALC", "CHOLHDL" in the last 168 hours.  Hematology Recent Labs  Lab 11/18/23 1311  WBC 8.6  RBC 5.64  HGB 16.3  HCT 49.0  MCV 86.9  MCH 28.9  MCHC 33.3  RDW 13.1  PLT 407*   Thyroid  Recent Labs  Lab 11/18/23 1311  TSH 3.475    BNP Recent Labs  Lab 11/18/23 1311  BNP 51.9    DDimer No results  for input(s): "DDIMER" in the last 168 hours.   Radiology/Studies:  CT Angio Chest PE W and/or Wo Contrast Result Date: 11/18/2023 CLINICAL DATA:  Pulmonary embolism (PE) suspected, high prob acute onset CP, SOB EXAM: CT ANGIOGRAPHY CHEST WITH CONTRAST TECHNIQUE: Multidetector CT imaging of the chest was performed using the standard protocol during bolus administration of intravenous contrast. Multiplanar CT image reconstructions and MIPs were obtained to evaluate the vascular anatomy. RADIATION DOSE REDUCTION: This exam was performed according to the departmental dose-optimization program which includes automated exposure control, adjustment of the mA and/or kV according to patient size and/or use of iterative reconstruction technique. CONTRAST:  75mL OMNIPAQUE IOHEXOL 350 MG/ML SOLN COMPARISON:  None Available. FINDINGS: Cardiovascular: Satisfactory opacification of the pulmonary arteries to the segmental level. No evidence of pulmonary embolism. Thoracic aorta is normal in course and caliber. Normal heart size. No pericardial effusion. Mediastinum/Nodes: No enlarged mediastinal, hilar, or axillary lymph nodes. Thyroid gland, trachea, and esophagus demonstrate no significant findings. Lungs/Pleura: Lungs are clear. No pleural effusion or pneumothorax. Upper Abdomen: See dedicated CT abdomen pelvis. Musculoskeletal: No acute osseous abnormality. Bilateral gynecomastia. Review of the MIP images confirms the above findings. IMPRESSION: No evidence of pulmonary embolism or other acute intrathoracic findings. Electronically Signed   By: Duanne Guess D.O.   On: 11/18/2023 17:00   CT  ABDOMEN PELVIS W CONTRAST Result Date: 11/18/2023 CLINICAL DATA:  Abdominal pain, acute, nonlocalized acute onset severe epigastric pain EXAM: CT ABDOMEN AND PELVIS WITH CONTRAST TECHNIQUE: Multidetector CT imaging of the abdomen and pelvis was performed using the standard protocol following bolus administration of intravenous contrast. RADIATION DOSE REDUCTION: This exam was performed according to the departmental dose-optimization program which includes automated exposure control, adjustment of the mA and/or kV according to patient size and/or use of iterative reconstruction technique. CONTRAST:  75mL OMNIPAQUE IOHEXOL 350 MG/ML SOLN COMPARISON:  None Available. FINDINGS: Lower chest: See dedicated CT chest report. Hepatobiliary: No focal liver abnormality is seen. No gallstones, gallbladder wall thickening, or biliary dilatation. Pancreas: Unremarkable. No pancreatic ductal dilatation or surrounding inflammatory changes. Spleen: Normal in size without focal abnormality. Adrenals/Urinary Tract: Adrenal glands are unremarkable. Kidneys are normal, without renal calculi, focal lesion, or hydronephrosis. Bladder is unremarkable. Stomach/Bowel: Stomach is within normal limits. Appendix appears normal. No evidence of bowel wall thickening, distention, or inflammatory changes. Vascular/Lymphatic: No significant vascular findings are present. No enlarged abdominal or pelvic lymph nodes. Reproductive: Prostate is unremarkable. Other: No free air or free fluid. Musculoskeletal: No acute or significant osseous findings. 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 IMPRESSION: 1. No acute abdominopelvic findings. 2. 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 Electronically Signed   By: Duanne Guess D.O.   On: 11/18/2023 16:56   DG Chest Portable 1 View Result Date: 11/18/2023 CLINICAL DATA:  Chest pain and dizziness. EXAM: PORTABLE CHEST 1 VIEW COMPARISON:  None  Available. FINDINGS: The heart size and mediastinal contours are within normal limits. No focal consolidation, pleural effusion, or pneumothorax. No acute osseous abnormality. IMPRESSION: No acute cardiopulmonary findings. Electronically Signed   By: Hart Robinsons M.D.   On: 11/18/2023 15:16     Assessment and Plan:   Atrial flutter with RVR Unclear when it started - better rate controlled after 5 mg IV lopressor - started heparin gtt - he is generally unaware of his flutter, but has been having chest discomfort intermittently over the last year - start 25 mg lopressor BID - TSH WNL -  obtain echo - consider TEE-DCCV tomorrow pending rate control - if well rate controlled, could consider three weeks of OAC and OP DCCV without TEE - would be a candidate for eliquis 5 mg BID - since we are planning TEE-DCCV tomorrow, will load with 10 mg eliquis tonight followed by 5 mg tomorrow morning - can stop heparin after 10 mg eliquis   Chest pain - troponin mildly elevated, likely related to RVR - he does describe chest pain that has been worse over the last three weeks with starting a new job that is more physically demanding - will monitor  Echo pending  Further testing based on symptoms and test findings    AKI - sCr 1.88 - unclear baseline - likely some dehydration - can treat with gentle fluids   Elevated glucose  Check Hgb A1C   Suspect OSA - snores - will need OP sleep study    TEE consent: The patient does NOT have any absolute or relative contraindications to a Transesophageal Echocardiogram (TEE).  The patient has: No other conditions that may impact this procedure.  The patient has been appropriately anticoagulated (received appropriate # of DOAC doses, on Lovenox or on IV Heparin and in therapeutic range).  After careful review of history and examination, the risks and benefits of transesophageal echocardiogram have been explained including risks of esophageal  damage, perforation (1:10,000 risk), bleeding, pharyngeal hematoma as well as other potential complications associated with conscious sedation including aspiration, arrhythmia, respiratory failure and death. Alternatives to treatment were discussed, questions were answered. Patient is willing to proceed.     Risk Assessment/Risk Scores:       CHA2DS2-VASc Score = 0   This indicates a 0.2% annual risk of stroke. The patient's score is based upon: CHF History: 0 HTN History: 0 Diabetes History: 0 Stroke History: 0 Vascular Disease History: 0 Age Score: 0 Gender Score: 0          For questions or updates, please contact [facility] Please consult [email or web address] for contact info under    Signed, Marcelino Duster, [place]  11/18/2023 6:47 PM  Pt seen and examined  I agree with findings as noted above by A [facility] Pt is a 33 yo with no prior cardiac Hx   Over past year has had intermittent chest pressure   With and without activity   Occurs 2  to 3 x per week    The pt does say he has noticed it more over past few wks with activity Today, got up for sitting    Felt lightheaded and developed chest pain      In ER found to be in atrial flutter approx 140 bpm    Rates improved with IV lopressor and his symptoms have subsided   Currently denies pain  On exam Pt in NAD Neck   Full  No bruits Lung CTA Cardiac RRR   No murmurs    Abd   Mild diffuse tenderness  Ext with 2+ pulses distally   1  Atrial flutter   Rates improved  with metoprolol  May convert Will start Eliquis  Schedule echo  With SOB and chest pressure will see about scheduling TEE / Cardioversion  2  Chest pressure / pain   May be related to rapid hear rates    Minimal elevation of trop may be related to demand in setting of rapid rates  Echo ordered    Follow   3  Elevated glucose  Check A1C  4  Renal   Cr 1.88   Follow   Dietrich Pates  MD

## 2023-11-18 NOTE — ED Provider Triage Note (Signed)
 Emergency Medicine Provider Triage Evaluation Note  HARVEL MESKILL , a 33 y.o. male  was evaluated in triage.  Pt complains of new onset chest pain 45 minutes prior to arrival, patient arrives diaphoretic, tachycardic,.  Review of Systems  Positive: Chest pain Negative: shob  Physical Exam  BP (!) 113/55   Pulse 69   Resp 16   SpO2 98%  Gen:   Awake, no distress   Resp:  Normal effort  MSK:   Moves extremities without difficulty  Other:  Diaphoretic, tachycardic, ill appearing  Medical Decision Making  Medically screening exam initiated at 12:55 PM.  Appropriate orders placed.  TAMAR LIPSCOMB was informed that the remainder of the evaluation will be completed by another provider, this initial triage assessment does not replace that evaluation, and the importance of remaining in the ED until their evaluation is complete.  Next room,   Olene Floss, [place] 11/18/23 1256

## 2023-11-18 NOTE — ED Provider Notes (Signed)
 Donald Ruiz EMERGENCY DEPARTMENT AT [facility] Provider Note   CSN: [id number] Arrival date & time: 11/18/23  1243     History  Chief Complaint  Patient presents with  . Chest Pain  . Dizziness    Donald Ruiz is a 33 y.o. male.  Patient presents to the urgency department for evaluation of severe chest pain starting at around noon today.  Symptoms started at rest.  Patient was watching TV.  He got up to use the restroom and began to feel very short of breath.  He had associated lightheadedness and diaphoresis.  He then developed pain in the lower chest and epigastrium.  He has associated headache.  Pain does not radiate to the neck or back.  No numbness, tingling or weakness in the arms of the legs.  No strokelike symptoms or vision changes.  Patient has had brief episodes that are somewhat similar in the past, but not persistent.  Patient denies family history of cardiac disease or cardiac death at a young age.  He does not smoke tobacco.  He uses cannabis, last use this morning.  Denies stimulants.  No recent travel or history of blood clots.  No lower extremity swelling or pain.       Home Medications Prior to Admission medications   Medication Sig Start Date End Date Taking? Authorizing Provider  acetaminophen (TYLENOL) 500 MG tablet Take 1 tablet (500 mg total) by mouth every 6 (six) hours as needed. 08/28/17   Law, Waylan Boga, PA-C  benzonatate (TESSALON) 100 MG capsule Take 1 capsule (100 mg total) by mouth every 8 (eight) hours. 08/28/17   Law, Waylan Boga, PA-C  ibuprofen (ADVIL,MOTRIN) 600 MG tablet Take 1 tablet (600 mg total) by mouth every 6 (six) hours as needed. 08/28/17   Law, Waylan Boga, PA-C  ondansetron (ZOFRAN) 4 MG tablet Take 1 tablet (4 mg total) by mouth every 6 (six) hours. 08/28/17   Emi Holes, PA-C      Allergies    Patient has no known allergies.    Review of Systems   Review of Systems  Physical Exam Updated Vital Signs BP (!)  113/55   Pulse 69   Temp 97.6 F (36.4 C) (Oral)   Resp 16   Ht 6\' 6"  (1.981 m)   Wt 127 kg   SpO2 98%   BMI 32.36 kg/m  Physical Exam Vitals and nursing note reviewed.  Constitutional:      General: He is in acute distress (Uncomfortable).     Appearance: He is well-developed. He is not diaphoretic.  HENT:     Head: Normocephalic and atraumatic.     Mouth/Throat:     Mouth: Mucous membranes are not dry.  Eyes:     Conjunctiva/sclera: Conjunctivae normal.  Neck:     Vascular: Normal carotid pulses. No carotid bruit or JVD.     Trachea: Trachea normal. No tracheal deviation.  Cardiovascular:     Rate and Rhythm: Regular rhythm. Tachycardia present.     Pulses: No decreased pulses.          Radial pulses are 2+ on the right side and 2+ on the left side.     Heart sounds: Normal heart sounds, S1 normal and S2 normal. Heart sounds not distant. No murmur heard. Pulmonary:     Effort: Pulmonary effort is normal. No respiratory distress.     Breath sounds: Normal breath sounds. No wheezing, rhonchi or rales.  Chest:  Chest wall: No tenderness.  Abdominal:     General: Bowel sounds are normal.     Palpations: Abdomen is soft.     Tenderness: There is no abdominal tenderness. There is no guarding or rebound.  Musculoskeletal:     Cervical back: Normal range of motion and neck supple. No muscular tenderness.     Right lower leg: No edema.     Left lower leg: No edema.  Skin:    General: Skin is warm and dry.     Coloration: Skin is not pale.  Neurological:     Mental Status: He is alert. Mental status is at baseline.  Psychiatric:        Mood and Affect: Mood normal.     ED Results / Procedures / Treatments   Labs (all labs ordered are listed, but only abnormal results are displayed) Labs Reviewed  CBC - Abnormal; Notable for the following components:      Result Value   Platelets 407 (*)    All other components within normal limits  COMPREHENSIVE METABOLIC PANEL  WITH GFR - Abnormal; Notable for the following components:   CO2 20 (*)    Glucose, Bld 233 (*)    Creatinine, Ser 1.88 (*)    GFR, Estimated 48 (*)    All other components within normal limits  TROPONIN I (HIGH SENSITIVITY) - Abnormal; Notable for the following components:   Troponin I (High Sensitivity) 20 (*)    All other components within normal limits  LIPASE, BLOOD  TSH  BRAIN NATRIURETIC PEPTIDE  RAPID URINE DRUG SCREEN, HOSP PERFORMED  CBG MONITORING, ED  TROPONIN I (HIGH SENSITIVITY)    ED ECG REPORT   Date: 11/18/2023  Rate: 139  Rhythm: atrial flutter  QRS Axis: right  Intervals: normal  ST/T Wave abnormalities: nonspecific ST/T changes  Conduction Disutrbances:none  Narrative Interpretation:   Old EKG Reviewed: none available  I have personally reviewed the EKG tracing and agree with the computerized printout as noted.   Radiology No results found.  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 History obtained directly from patient. Seen on arrival. Seen shortly after with Dr. Rhunette Croft. Reviewed EKG with Dr. Rhunette Croft.   EMERGENCY DEPARTMENT [place] CARDIAC EXAM "Study: Limited Ultrasound of the Heart and  Pericardium"  INDICATIONS:Abnormal vital signs and Chest pain Multiple views of the heart and pericardium were obtained in real-time with a multi-frequency probe.  PERFORMED ZO:XWRUEA IMAGES ARCHIVED?: Yes LIMITATIONS:  Body habitus VIEWS USED: Subcostal 4 chamber, Parasternal long axis, and Parasternal short axis INTERPRETATION: Cardiac activity present, Pericardial effusioin absent, and Normal contractility    Labs/EKG: Ordered CBC, CMP, troponin, BNP.  Imaging: Ordered chest x-ray, CT angio of the chest to evaluate for  PE, CT abdomen pelvis given epigastric pain and unknown etiology of symptoms, rule out gastric perforation.  Medications/Fluids: Ordered: Fentanyl, Zofran, fluid bolus.   Most recent vital signs reviewed and are as follows: BP (!) 113/55   Pulse 69   Temp 97.6 F (36.4 C) (Oral)   Resp 16   Ht 6\' 6"  (1.981 m)   Wt 127 kg   SpO2 98%   BMI 32.36 kg/m   Initial impression: Acute onset of chest pain and epigastric pain, severe, with associated atrial flutter with RVR.  Patient does state brief short spells of similar symptoms in the past.  Unclear if chest pain is related to rate or there is some underlying issue driving the atrial flutter.  Will evaluate for PE, ACS, myocarditis, pulmonary disease and abdominal causes such as gallbladder disease (although no obvious gallstones on ultrasound), pancreatitis, perforated viscus, etc.  1:42 PM Discussed with Dr. Rhunette Croft. Will give metoprolol for rate control, only 5mg  x 1.   2:48 PM patient stable.  Speaking with registration at bedside.  Heart rate into the lower 100s.  3:15 PM Signout to resident Dr. Clemon Chambers at shift change. Dr. Jacqulyn Bath also aware.   Will need follow-up on labs and imaging.  Based on results consider admission versus cardiology consultation given severe chest pain with new onset of arrhythmia.                                Medical Decision Making Amount and/or Complexity of Data Reviewed Labs:  ordered. Radiology: ordered.  Risk OTC drugs.   For this patient's complaint of chest pain, the following emergent conditions were considered on the differential diagnosis: acute coronary syndrome, pulmonary embolism, pneumothorax, myocarditis, pericardial tamponade, aortic dissection, thoracic aortic aneurysm complication, esophageal perforation.   Other causes were also considered including: gastroesophageal reflux disease, musculoskeletal pain including costochondritis, pneumonia/pleurisy, herpes zoster, pericarditis.  Currently awaiting completion of work-up. HR elevated due to new aflutter, improving with pain control and metoprolol x 1.           Final Clinical Impression(s) / ED Diagnoses Final diagnoses:  Precordial pain  Atrial flutter with rapid ventricular response [facility])    Rx / DC Orders ED Discharge Orders     None         Renne Crigler, PA-C 11/18/23 1531    Derwood Kaplan, MD 11/22/23 1536

## 2023-11-18 NOTE — ED Notes (Signed)
 Pt states he is starting to get a headache. EDP aware  RN requested pain medication

## 2023-11-18 NOTE — ED Triage Notes (Signed)
 Pt here for centralized CP for the last hour.  Pt is very diaphoretic. Pt is nauseous.

## 2023-11-18 NOTE — ED Notes (Signed)
 Allean Found PA completing [place] of chest  Dr. Rhunette Croft at bedside

## 2023-11-19 ENCOUNTER — Observation Stay (HOSPITAL_COMMUNITY): Payer: Self-pay | Admitting: Anesthesiology

## 2023-11-19 ENCOUNTER — Encounter (HOSPITAL_COMMUNITY)
Admission: EM | Disposition: A | Discharge status: Home / Self Care | Payer: Self-pay | Source: Home / Self Care | Attending: Family Medicine

## 2023-11-19 ENCOUNTER — Observation Stay (HOSPITAL_COMMUNITY): Payer: Self-pay

## 2023-11-19 ENCOUNTER — Encounter (HOSPITAL_COMMUNITY): Payer: Self-pay | Admitting: Internal Medicine

## 2023-11-19 DIAGNOSIS — I4892 Unspecified atrial flutter: Secondary | ICD-10-CM

## 2023-11-19 DIAGNOSIS — I483 Typical atrial flutter: Secondary | ICD-10-CM

## 2023-11-19 DIAGNOSIS — I4891 Unspecified atrial fibrillation: Secondary | ICD-10-CM

## 2023-11-19 DIAGNOSIS — I340 Nonrheumatic mitral (valve) insufficiency: Secondary | ICD-10-CM

## 2023-11-19 DIAGNOSIS — I361 Nonrheumatic tricuspid (valve) insufficiency: Secondary | ICD-10-CM

## 2023-11-19 DIAGNOSIS — I5020 Unspecified systolic (congestive) heart failure: Secondary | ICD-10-CM

## 2023-11-19 LAB — CBC
HCT: 44.8 % (ref 39.0–52.0)
Hemoglobin: 14.9 g/dL (ref 13.0–17.0)
MCH: 29 pg (ref 26.0–34.0)
MCHC: 33.3 g/dL (ref 30.0–36.0)
MCV: 87.3 fL (ref 80.0–100.0)
Platelets: 333 10*3/uL (ref 150–400)
RBC: 5.13 MIL/uL (ref 4.22–5.81)
RDW: 13.2 % (ref 11.5–15.5)
WBC: 8.5 10*3/uL (ref 4.0–10.5)
nRBC: 0 % (ref 0.0–0.2)

## 2023-11-19 LAB — COMPREHENSIVE METABOLIC PANEL WITH GFR
ALT: 25 U/L (ref 0–44)
AST: 27 U/L (ref 15–41)
Albumin: 3.5 g/dL (ref 3.5–5.0)
Alkaline Phosphatase: 58 U/L (ref 38–126)
Anion gap: 6 (ref 5–15)
BUN: 19 mg/dL (ref 6–20)
CO2: 24 mmol/L (ref 22–32)
Calcium: 8.6 mg/dL — ABNORMAL LOW (ref 8.9–10.3)
Chloride: 107 mmol/L (ref 98–111)
Creatinine, Ser: 1.3 mg/dL — ABNORMAL HIGH (ref 0.61–1.24)
GFR, Estimated: >60 mL/min (ref >60)
Glucose, Bld: 98 mg/dL (ref 70–99)
Potassium: 4 mmol/L (ref 3.5–5.1)
Sodium: 137 mmol/L (ref 135–145)
Total Bilirubin: 0.7 mg/dL (ref 0.0–1.2)
Total Protein: 6.5 g/dL (ref 6.5–8.1)

## 2023-11-19 LAB — ECHOCARDIOGRAM COMPLETE
AR max vel: 3.04 cm2
AV Peak grad: 6.8 mmHg
Ao pk vel: 1.3 m/s
Area-P 1/2: 5.66 cm2
Calc EF: 6.7 %
Est EF: <20
Height: 78 in
MV M vel: 4.38 m/s
MV Peak grad: 76.7 mmHg
S' Lateral: 6.1 cm
Single Plane A2C EF: 15.4 %
Single Plane A4C EF: 5.8 %
Weight: 5044.12 [oz_av]

## 2023-11-19 LAB — GLUCOSE, CAPILLARY
Glucose-Capillary: 112 mg/dL — ABNORMAL HIGH (ref 70–99)
Glucose-Capillary: 167 mg/dL — ABNORMAL HIGH (ref 70–99)
Glucose-Capillary: 69 mg/dL — ABNORMAL LOW (ref 70–99)
Glucose-Capillary: 98 mg/dL (ref 70–99)

## 2023-11-19 LAB — ECHO TEE: Est EF: <20

## 2023-11-19 LAB — HEMOGLOBIN A1C
Hgb A1c MFr Bld: 5.5 % (ref 4.8–5.6)
Mean Plasma Glucose: 111.15 mg/dL

## 2023-11-19 LAB — TROPONIN I (HIGH SENSITIVITY): Troponin I (High Sensitivity): 71 ng/L — ABNORMAL HIGH (ref <18)

## 2023-11-19 SURGERY — TRANSESOPHAGEAL ECHOCARDIOGRAM (TEE) (CATHLAB)
Anesthesia: Monitor Anesthesia Care

## 2023-11-19 MED ORDER — METOPROLOL SUCCINATE ER 25 MG PO TB24
12.5000 mg | ORAL_TABLET | Freq: Every day | ORAL | Status: DC
Start: 2023-11-19 — End: 2023-11-19

## 2023-11-19 MED ORDER — DEXTROSE 50 % IV SOLN
12.5000 g | INTRAVENOUS | Status: AC
  Administered 2023-11-19: 12.5 g via INTRAVENOUS
  Filled 2023-11-19: qty 50

## 2023-11-19 MED ORDER — PROPOFOL 500 MG/50ML IV EMUL
INTRAVENOUS | Status: DC | PRN
  Administered 2023-11-19: 100 mg via INTRAVENOUS
  Administered 2023-11-19: 100 ug/kg/min via INTRAVENOUS

## 2023-11-19 MED ORDER — SODIUM CHLORIDE 0.9 % IV SOLN: INTRAVENOUS | Status: DC

## 2023-11-19 MED ORDER — MELATONIN 5 MG PO TABS
5.0000 mg | ORAL_TABLET | Freq: Every evening | ORAL | Status: DC | PRN
  Administered 2023-11-19: 5 mg via ORAL
  Filled 2023-11-19: qty 1

## 2023-11-19 MED ORDER — AMIODARONE HCL 200 MG PO TABS
200.0000 mg | ORAL_TABLET | Freq: Two times a day (BID) | ORAL | Status: DC
  Administered 2023-11-19 – 2023-11-20 (×3): 200 mg via ORAL
  Filled 2023-11-19 (×3): qty 1

## 2023-11-19 MED ORDER — METOPROLOL SUCCINATE ER 25 MG PO TB24
25.0000 mg | ORAL_TABLET | Freq: Every day | ORAL | Status: DC
  Administered 2023-11-19 – 2023-11-20 (×2): 25 mg via ORAL
  Filled 2023-11-19 (×2): qty 1

## 2023-11-19 MED ORDER — LOSARTAN POTASSIUM 25 MG PO TABS
12.5000 mg | ORAL_TABLET | Freq: Every day | ORAL | Status: DC
  Administered 2023-11-19 – 2023-11-20 (×2): 12.5 mg via ORAL
  Filled 2023-11-19 (×2): qty 1

## 2023-11-19 SURGICAL SUPPLY — 1 items: PAD DEFIB RADIO PHYSIO CONN (PAD) ×1 IMPLANT

## 2023-11-19 NOTE — Plan of Care (Signed)
  Problem: Tissue Perfusion: Goal: Adequacy of tissue perfusion will improve Outcome: Progressing   Problem: Coping: Goal: Level of anxiety will decrease Outcome: Progressing   Problem: Pain Managment: Goal: General experience of comfort will improve and/or be controlled Outcome: Progressing   Problem: Education: Goal: Knowledge of disease or condition will improve Outcome: Progressing

## 2023-11-19 NOTE — Progress Notes (Addendum)
 Rounding Note    Patient Name: Donald Ruiz Date of Encounter: 11/19/2023  [facility] Cardiologist: Dietrich Pates, MD   Subjective   Seen in endoscopy prior to TEE-DCCV. No complaints  Inpatient Medications    Scheduled Meds:  [MAR Hold] apixaban  5 mg Oral BID   [MAR Hold] metoprolol tartrate  25 mg Oral BID   [MAR Hold] sodium chloride flush  3 mL Intravenous Q12H   Continuous Infusions:  sodium chloride 21 mL/hr at 11/19/23 0823   PRN Meds: [MAR Hold] acetaminophen **OR** [MAR Hold] acetaminophen, [MAR Hold] polyethylene glycol   Vital Signs    Vitals:   11/19/23 0354 11/19/23 0802 11/19/23 0815 11/19/23 1036  BP: (!) 107/52 (!) 112/95 (!) 112/95 (!) 131/91  Pulse: 72 (!) 105 (!) 105 90  Resp: 15 18  20   Temp: 98 F (36.7 C) 98.3 F (36.8 C)  97.6 F (36.4 C)  TempSrc: Oral Oral  Temporal  SpO2: 96% 98%  96%  Weight:      Height:        Intake/Output Summary (Last 24 hours) at 11/19/2023 1136 Last data filed at 11/19/2023 0000 Gross per 24 hour  Intake 86.5 ml  Output --  Net 86.5 ml      11/19/2023   12:08 AM 11/18/2023   12:55 PM 03/11/2020    1:51 PM  Last 3 Weights  Weight (lbs) 315 lb 4.1 oz 280 lb 260 lb  Weight (kg) 143 kg 127.007 kg 117.935 kg      Telemetry    Atrial flutter with VR 120s - Personally Reviewed  ECG    Atrial flutter with ventricular rate 116 - Personally Reviewed  Physical Exam   GEN: No acute distress.   Neck: No JVD Cardiac: irregular rhythm, tachycardic rate Respiratory: Clear to auscultation bilaterally. GI: Soft, nontender, non-distended  MS: No edema; No deformity. Neuro:  Nonfocal  Psych: Normal affect   Labs    High Sensitivity Troponin:   Recent Labs  Lab 11/18/23 1311 11/18/23 1629 11/18/23 2039 11/19/23 0913  TROPONINIHS 20* 162* 204* 71*     Chemistry Recent Labs  Lab 11/18/23 1311 11/18/23 1912 11/19/23 0331  NA 137  --  137  K 4.0  --  4.0  CL 102  --  107  CO2 20*   --  24  GLUCOSE 233*  --  98  BUN 17  --  19  CREATININE 1.88*  --  1.30*  CALCIUM 9.2  --  8.6*  MG  --  1.9  --   PROT 7.5  --  6.5  ALBUMIN 4.1  --  3.5  AST 37  --  27  ALT 31  --  25  ALKPHOS 71  --  58  BILITOT 0.9  --  0.7  GFRNONAA 48*  --  >60  ANIONGAP 15  --  6    Lipids  Recent Labs  Lab 11/18/23 2039  CHOL 134  TRIG 36  HDL 26*  LDLCALC 101*  CHOLHDL 5.2    Hematology Recent Labs  Lab 11/18/23 1311 11/19/23 0331  WBC 8.6 8.5  RBC 5.64 5.13  HGB 16.3 14.9  HCT 49.0 44.8  MCV 86.9 87.3  MCH 28.9 29.0  MCHC 33.3 33.3  RDW 13.1 13.2  PLT 407* 333   Thyroid  Recent Labs  Lab 11/18/23 1311  TSH 3.475    BNP Recent Labs  Lab 11/18/23 1311  BNP 51.9  DDimer No results for input(s): "DDIMER" in the last 168 hours.   Radiology    CT Angio Chest PE W and/or Wo Contrast Result Date: 11/18/2023 CLINICAL DATA:  Pulmonary embolism (PE) suspected, high prob acute onset CP, SOB EXAM: CT ANGIOGRAPHY CHEST WITH CONTRAST TECHNIQUE: Multidetector CT imaging of the chest was performed using the standard protocol during bolus administration of intravenous contrast. Multiplanar CT image reconstructions and MIPs were obtained to evaluate the vascular anatomy. RADIATION DOSE REDUCTION: This exam was performed according to the departmental dose-optimization program which includes automated exposure control, adjustment of the mA and/or kV according to patient size and/or use of iterative reconstruction technique. CONTRAST:  75mL OMNIPAQUE IOHEXOL 350 MG/ML SOLN COMPARISON:  None Available. FINDINGS: Cardiovascular: Satisfactory opacification of the pulmonary arteries to the segmental level. No evidence of pulmonary embolism. Thoracic aorta is normal in course and caliber. Normal heart size. No pericardial effusion. Mediastinum/Nodes: No enlarged mediastinal, hilar, or axillary lymph nodes. Thyroid gland, trachea, and esophagus demonstrate no significant findings.  Lungs/Pleura: Lungs are clear. No pleural effusion or pneumothorax. Upper Abdomen: See dedicated CT abdomen pelvis. Musculoskeletal: No acute osseous abnormality. Bilateral gynecomastia. Review of the MIP images confirms the above findings. IMPRESSION: No evidence of pulmonary embolism or other acute intrathoracic findings. Electronically Signed   By: Duanne Guess D.O.   On: 11/18/2023 17:00   CT ABDOMEN PELVIS W CONTRAST Result Date: 11/18/2023 CLINICAL DATA:  Abdominal pain, acute, nonlocalized acute onset severe epigastric pain EXAM: CT ABDOMEN AND PELVIS WITH CONTRAST TECHNIQUE: Multidetector CT imaging of the abdomen and pelvis was performed using the standard protocol following bolus administration of intravenous contrast. RADIATION DOSE REDUCTION: This exam was performed according to the departmental dose-optimization program which includes automated exposure control, adjustment of the mA and/or kV according to patient size and/or use of iterative reconstruction technique. CONTRAST:  75mL OMNIPAQUE IOHEXOL 350 MG/ML SOLN COMPARISON:  None Available. FINDINGS: Lower chest: See dedicated CT chest report. Hepatobiliary: No focal liver abnormality is seen. No gallstones, gallbladder wall thickening, or biliary dilatation. Pancreas: Unremarkable. No pancreatic ductal dilatation or surrounding inflammatory changes. Spleen: Normal in size without focal abnormality. Adrenals/Urinary Tract: Adrenal glands are unremarkable. Kidneys are normal, without renal calculi, focal lesion, or hydronephrosis. Bladder is unremarkable. Stomach/Bowel: Stomach is within normal limits. Appendix appears normal. No evidence of bowel wall thickening, distention, or inflammatory changes. Vascular/Lymphatic: No significant vascular findings are present. No enlarged abdominal or pelvic lymph nodes. Reproductive: Prostate is unremarkable. Other: No free air or free fluid. Musculoskeletal: No acute or significant osseous findings.  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 IMPRESSION: 1. No acute abdominopelvic findings. 2. 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 Electronically Signed   By: Duanne Guess D.O.   On: 11/18/2023 16:56   DG Chest Portable 1 View Result Date: 11/18/2023 CLINICAL DATA:  Chest pain and dizziness. EXAM: PORTABLE CHEST 1 VIEW COMPARISON:  None Available. FINDINGS: The heart size and mediastinal contours are within normal limits. No focal consolidation, pleural effusion, or pneumothorax. No acute osseous abnormality. IMPRESSION: No acute cardiopulmonary findings. Electronically Signed   By: Hart Robinsons M.D.   On: 11/18/2023 15:16    Cardiac Studies   Echo - read pending  Patient Profile     34 y.o. male with no prior cardiac history presented to [facility] 11/18/23 found to have atrial flutter with RVR and AKI.  Assessment & Plan    Atrial flutter with RVR  Typical flutter  -  better rate controlled after 5 mg IV lopressor in the ER - admitted for TEE-DCCV today - remains in atrial flutter in the 120s - was treated with 10 mg eliquis last night and 5 mg eliquis this morning - will need to remain on 5 mg eliquis BID  For now to keep out of atrial flutter (since he did not sense it) I will add amiodarone 200 mg bid   This will be short term until sees EP  HFrEF   New Dx   LVEF is severely dilated and LVEF is severely depressed  May be tachy induced     Will slowly begin GDMT for HFrEF)  Pt wsa on metoprolol 25 bid   Will switch to toprol XL 25   Also add losartan 12.5 mg    follow BP  BP may improve (once propofol fully wears off) to allow further titration with aldactone and farxiga  I spent extensive time with pt and his mother discussing HFrEF, the different possible causes, the treatment and the follow up     Appts being made for follow up in [place]   AKI - sCr 1.88 --> improved to 1.30 - suspect this is near his baseline -  has not been hypertensive Check again in am    Elevated troponin - peaked at 204, now down trending Most likely demand in setting of HFrEF and rapid atrial flutter    Daily THC use - counseled on cessation  Metabolics  A1C 5.4/ 5.5  Suspect OSA - will need to be set up for  outpatient sleep study   Plan for d/c in am       For questions or updates, please contact [facility] Please consult [email or web address] for contact info under        Signed, Marcelino Duster, PA  11/19/2023, 11:36 AM    Patient seen   I have amended note above by [facility] to complete assessment   Pt is currently comfortable laying in bed  Lungs CTA Cardiac RRR  No S3  Abd is benign Ext   No LE edema     REcommendations as noted above    Pt has [facility]t [facility]23 at 1[facility] I will also be making  for pt with Rosette Reveal to discuss atrial flutter ablation  Dietrich Pates MD

## 2023-11-19 NOTE — Anesthesia Postprocedure Evaluation (Signed)
 Anesthesia Post Note  Patient: Donald Ruiz  Procedure(s) Performed: TRANSESOPHAGEAL ECHOCARDIOGRAM CARDIOVERSION     Patient location during evaluation: PACU Anesthesia Type: MAC Level of consciousness: awake and alert Pain management: pain level controlled Vital Signs Assessment: post-procedure vital signs reviewed and stable Respiratory status: spontaneous breathing, nonlabored ventilation and respiratory function stable Cardiovascular status: blood pressure returned to baseline and stable Postop Assessment: no apparent nausea or vomiting Anesthetic complications: no   No notable events documented.  Last Vitals:  Vitals:   11/19/23 1239 11/19/23 1251  BP: 110/70 105/75  Pulse:  81  Resp:  14  Temp:  37 C  SpO2:  96%    Last Pain:  Vitals:   11/19/23 1251  TempSrc: Oral  PainSc: 0-No pain                 Lowella Curb

## 2023-11-19 NOTE — Progress Notes (Signed)
 Hypoglycemic Event  CBG: 69  Treatment: D50 25 mL (12.5 gm)  Symptoms: None  Follow-up CBG: Time:0829 CBG Result:167  Possible Reasons for Event: Inadequate meal intake  Comments/MD notified:yes    [name]

## 2023-11-19 NOTE — Hospital Course (Signed)
 Donald Ruiz is a 33 y.o. male with no known medical history.  Patient presented secondary to chest pain and was found to have atrial flutter with rapid ventricular response. Cardiology consulted. Patient managed on metoprolol with plan for Transesophageal Echocardiogram DCCV.

## 2023-11-19 NOTE — Discharge Instructions (Addendum)
 Donald Ruiz,  You are in the hospital because of an arrhythmia called atrial flutter.  This was managed by shocking your heart and returning it to a regular rhythm.  You have been started on medications to help control your heart rate and rhythm.  You are also found to have a weak functioning heart; this is called heart failure.  You will need to manage your diet by eating less salt and limiting your fluid intake to 1.5 to 2 L/day.  Is important that you follow-up with a cardiologist for continued management of your heart issues.

## 2023-11-19 NOTE — Progress Notes (Signed)
 PROGRESS NOTE    Donald Ruiz  NLZ:[id number] DOB: May 11, 1991 DOA: 11/18/2023 PCP: No primary care provider on file.   Brief Narrative: Donald Ruiz is a 33 y.o. male with no known medical history.  Patient presented secondary to chest pain and was found to have atrial flutter with rapid ventricular response. Cardiology consulted. Patient managed on metoprolol with plan for Transesophageal Echocardiogram DCCV.   Assessment and Plan:  Chest pain Initial concern for possible NSTEMI. Troponin peak of 204 with EKG significant for atrial flutter with RVR. Chest pain significantly improved since yesterday. Likely mediated by rapid flutter. -Check troponin to ensure peak reached -Transthoracic Echocardiogram pending  Atrial flutter with rapid ventricular response New diagnosis. Cardiology consulted. Patient managed on metoprolol and cardiology plan Transesophageal Echocardiogram with cardioversion. Eliquis started. -Cardiology recommendations: Transesophageal Echocardiogram/DCCV, Eliquis -Transthoracic Echocardiogram pending  AKI Mild. Baseline creatinine of 1.3. Creatinine up to 1.8 but improved to baseline after IV fluids.  Hyperglycemia Unclear etiology. Hemoglobin A1C of 5.5%.    DVT prophylaxis: Eliquis Code Status:   Code Status: Full Code Family Communication: Mother and uncle at bedside Disposition Plan: Discharge pending ongoing cardiology recommendations   Consultants:  Cardiology  Procedures:  none  Antimicrobials: None    Subjective: Patient reports feeling better than yesterday. He reports still having palpitations, however.  Objective: BP (!) 112/95   Pulse (!) 105   Temp 98.3 F (36.8 C) (Oral)   Resp 18   Ht 6\' 6"  (1.981 m)   Wt (!) 143 kg   SpO2 98%   BMI 36.43 kg/m   Examination:  General exam: Appears calm and comfortable Respiratory system: Clear to auscultation. Respiratory effort normal. Cardiovascular system: S1 & S2  heard, irregular rhythm. Normal-fast rates. Gastrointestinal system: Abdomen is nondistended, soft and nontender. No organomegaly or masses felt. Normal bowel sounds heard. Central nervous system: Alert and oriented. No focal neurological deficits. Musculoskeletal: No edema. No calf tenderness Psychiatry: Judgement and insight appear normal. Mood & affect appropriate.    Data Reviewed: I have personally reviewed following labs and imaging studies  CBC Lab Results  Component Value Date   WBC 8.5 11/19/2023   RBC 5.13 11/19/2023   HGB 14.9 11/19/2023   HCT 44.8 11/19/2023   MCV 87.3 11/19/2023   MCH 29.0 11/19/2023   PLT 333 11/19/2023   MCHC 33.3 11/19/2023   RDW 13.2 11/19/2023     Last metabolic panel Lab Results  Component Value Date   NA 137 11/19/2023   K 4.0 11/19/2023   CL 107 11/19/2023   CO2 24 11/19/2023   BUN 19 11/19/2023   CREATININE 1.30 (H) 11/19/2023   GLUCOSE 98 11/19/2023   GFRNONAA >60 11/19/2023   GFRAA >60 08/28/2017   CALCIUM 8.6 (L) 11/19/2023   PROT 6.5 11/19/2023   ALBUMIN 3.5 11/19/2023   BILITOT 0.7 11/19/2023   ALKPHOS 58 11/19/2023   AST 27 11/19/2023   ALT 25 11/19/2023   ANIONGAP 6 11/19/2023    GFR: Estimated Creatinine Clearance: 129.2 mL/min (A) (by C-G formula based on SCr of 1.3 mg/dL (H)).  No results found for this or any previous visit (from the past 240 hours).    Radiology Studies: CT Angio Chest PE W and/or Wo Contrast Result Date: 11/18/2023 CLINICAL DATA:  Pulmonary embolism (PE) suspected, high prob acute onset CP, SOB EXAM: CT ANGIOGRAPHY CHEST WITH CONTRAST TECHNIQUE: Multidetector CT imaging of the chest was performed using the standard protocol during bolus administration of intravenous contrast.  Multiplanar CT image reconstructions and MIPs were obtained to evaluate the vascular anatomy. RADIATION DOSE REDUCTION: This exam was performed according to the departmental dose-optimization program which includes  automated exposure control, adjustment of the mA and/or kV according to patient size and/or use of iterative reconstruction technique. CONTRAST:  75mL OMNIPAQUE IOHEXOL 350 MG/ML SOLN COMPARISON:  None Available. FINDINGS: Cardiovascular: Satisfactory opacification of the pulmonary arteries to the segmental level. No evidence of pulmonary embolism. Thoracic aorta is normal in course and caliber. Normal heart size. No pericardial effusion. Mediastinum/Nodes: No enlarged mediastinal, hilar, or axillary lymph nodes. Thyroid gland, trachea, and esophagus demonstrate no significant findings. Lungs/Pleura: Lungs are clear. No pleural effusion or pneumothorax. Upper Abdomen: See dedicated CT abdomen pelvis. Musculoskeletal: No acute osseous abnormality. Bilateral gynecomastia. Review of the MIP images confirms the above findings. IMPRESSION: No evidence of pulmonary embolism or other acute intrathoracic findings. Electronically Signed   By: Duanne Guess D.O.   On: 11/18/2023 17:00   CT ABDOMEN PELVIS W CONTRAST Result Date: 11/18/2023 CLINICAL DATA:  Abdominal pain, acute, nonlocalized acute onset severe epigastric pain EXAM: CT ABDOMEN AND PELVIS WITH CONTRAST TECHNIQUE: Multidetector CT imaging of the abdomen and pelvis was performed using the standard protocol following bolus administration of intravenous contrast. RADIATION DOSE REDUCTION: This exam was performed according to the departmental dose-optimization program which includes automated exposure control, adjustment of the mA and/or kV according to patient size and/or use of iterative reconstruction technique. CONTRAST:  75mL OMNIPAQUE IOHEXOL 350 MG/ML SOLN COMPARISON:  None Available. FINDINGS: Lower chest: See dedicated CT chest report. Hepatobiliary: No focal liver abnormality is seen. No gallstones, gallbladder wall thickening, or biliary dilatation. Pancreas: Unremarkable. No pancreatic ductal dilatation or surrounding inflammatory changes. Spleen:  Normal in size without focal abnormality. Adrenals/Urinary Tract: Adrenal glands are unremarkable. Kidneys are normal, without renal calculi, focal lesion, or hydronephrosis. Bladder is unremarkable. Stomach/Bowel: Stomach is within normal limits. Appendix appears normal. No evidence of bowel wall thickening, distention, or inflammatory changes. Vascular/Lymphatic: No significant vascular findings are present. No enlarged abdominal or pelvic lymph nodes. Reproductive: Prostate is unremarkable. Other: No free air or free fluid. Musculoskeletal: No acute or significant osseous findings. 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 IMPRESSION: 1. No acute abdominopelvic findings. 2. 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 Electronically Signed   By: Duanne Guess D.O.   On: 11/18/2023 16:56   DG Chest Portable 1 View Result Date: 11/18/2023 CLINICAL DATA:  Chest pain and dizziness. EXAM: PORTABLE CHEST 1 VIEW COMPARISON:  None Available. FINDINGS: The heart size and mediastinal contours are within normal limits. No focal consolidation, pleural effusion, or pneumothorax. No acute osseous abnormality. IMPRESSION: No acute cardiopulmonary findings. Electronically Signed   By: Hart Robinsons M.D.   On: 11/18/2023 15:16      LOS: 0 days    Jacquelin Hawking, MD [facility] 11/19/2023, 9:53 AM   If 7PM-7AM, please contact night-coverage [email or web address]

## 2023-11-19 NOTE — Interval H&P Note (Signed)
 History and Physical Interval Note:  11/19/2023 10:53 AM  Donald Ruiz  has presented today for surgery, with the diagnosis of aflutter.  The various methods of treatment have been discussed with the patient and family. After consideration of risks, benefits and other options for treatment, the patient has consented to  Procedure(s): TRANSESOPHAGEAL ECHOCARDIOGRAM (N/A) CARDIOVERSION (N/A) as a surgical intervention.  The patient's history has been reviewed, patient examined, no change in status, stable for surgery.  I have reviewed the patient's chart and labs.  Questions were answered to the patient's satisfaction.     Olga Millers

## 2023-11-19 NOTE — Transfer of Care (Addendum)
 Immediate Anesthesia Transfer of Care Note  Patient: Donald Ruiz  Procedure(s) Performed: TRANSESOPHAGEAL ECHOCARDIOGRAM CARDIOVERSION  Patient Location: PACU   Anesthesia Type:MAC  Level of Consciousness: awake, alert , and oriented  Airway & Oxygen Therapy: Patient connected to nasal cannula oxygen  Post-op Assessment: Report given to RN and Post -op Vital signs reviewed and stable  Post vital signs: Reviewed and stable  Last Vitals:  Vitals Value Taken Time  BP 101/57 11/19/23 1218  Temp    Pulse 86 11/19/23 1221  Resp 11 11/19/23 1221  SpO2 94 % 11/19/23 1221  Vitals shown include unfiled device data.  Last Pain:  Vitals:   11/19/23 1218  TempSrc:   PainSc: 0-No pain         Complications: No notable events documented.

## 2023-11-19 NOTE — Progress Notes (Signed)
 Echocardiogram 2D Echocardiogram has been performed.  Lucendia Herrlich 11/19/2023, 10:00 AM

## 2023-11-19 NOTE — ED Notes (Signed)
 Patient transported to admitting floor via stretcher. All belongings sent with patient. Patient without questions at time of transport.

## 2023-11-19 NOTE — H&P (View-Only) (Signed)
 PROGRESS NOTE    Donald Ruiz  NLZ:[id number] DOB: May 11, 1991 DOA: 11/18/2023 PCP: No primary care provider on file.   Brief Narrative: Donald Ruiz is a 33 y.o. male with no known medical history.  Patient presented secondary to chest pain and was found to have atrial flutter with rapid ventricular response. Cardiology consulted. Patient managed on metoprolol with plan for Transesophageal Echocardiogram DCCV.   Assessment and Plan:  Chest pain Initial concern for possible NSTEMI. Troponin peak of 204 with EKG significant for atrial flutter with RVR. Chest pain significantly improved since yesterday. Likely mediated by rapid flutter. -Check troponin to ensure peak reached -Transthoracic Echocardiogram pending  Atrial flutter with rapid ventricular response New diagnosis. Cardiology consulted. Patient managed on metoprolol and cardiology plan Transesophageal Echocardiogram with cardioversion. Eliquis started. -Cardiology recommendations: Transesophageal Echocardiogram/DCCV, Eliquis -Transthoracic Echocardiogram pending  AKI Mild. Baseline creatinine of 1.3. Creatinine up to 1.8 but improved to baseline after IV fluids.  Hyperglycemia Unclear etiology. Hemoglobin A1C of 5.5%.    DVT prophylaxis: Eliquis Code Status:   Code Status: Full Code Family Communication: Mother and uncle at bedside Disposition Plan: Discharge pending ongoing cardiology recommendations   Consultants:  Cardiology  Procedures:  none  Antimicrobials: None    Subjective: Patient reports feeling better than yesterday. He reports still having palpitations, however.  Objective: BP (!) 112/95   Pulse (!) 105   Temp 98.3 F (36.8 C) (Oral)   Resp 18   Ht 6\' 6"  (1.981 m)   Wt (!) 143 kg   SpO2 98%   BMI 36.43 kg/m   Examination:  General exam: Appears calm and comfortable Respiratory system: Clear to auscultation. Respiratory effort normal. Cardiovascular system: S1 & S2  heard, irregular rhythm. Normal-fast rates. Gastrointestinal system: Abdomen is nondistended, soft and nontender. No organomegaly or masses felt. Normal bowel sounds heard. Central nervous system: Alert and oriented. No focal neurological deficits. Musculoskeletal: No edema. No calf tenderness Psychiatry: Judgement and insight appear normal. Mood & affect appropriate.    Data Reviewed: I have personally reviewed following labs and imaging studies  CBC Lab Results  Component Value Date   WBC 8.5 11/19/2023   RBC 5.13 11/19/2023   HGB 14.9 11/19/2023   HCT 44.8 11/19/2023   MCV 87.3 11/19/2023   MCH 29.0 11/19/2023   PLT 333 11/19/2023   MCHC 33.3 11/19/2023   RDW 13.2 11/19/2023     Last metabolic panel Lab Results  Component Value Date   NA 137 11/19/2023   K 4.0 11/19/2023   CL 107 11/19/2023   CO2 24 11/19/2023   BUN 19 11/19/2023   CREATININE 1.30 (H) 11/19/2023   GLUCOSE 98 11/19/2023   GFRNONAA >60 11/19/2023   GFRAA >60 08/28/2017   CALCIUM 8.6 (L) 11/19/2023   PROT 6.5 11/19/2023   ALBUMIN 3.5 11/19/2023   BILITOT 0.7 11/19/2023   ALKPHOS 58 11/19/2023   AST 27 11/19/2023   ALT 25 11/19/2023   ANIONGAP 6 11/19/2023    GFR: Estimated Creatinine Clearance: 129.2 mL/min (A) (by C-G formula based on SCr of 1.3 mg/dL (H)).  No results found for this or any previous visit (from the past 240 hours).    Radiology Studies: CT Angio Chest PE W and/or Wo Contrast Result Date: 11/18/2023 CLINICAL DATA:  Pulmonary embolism (PE) suspected, high prob acute onset CP, SOB EXAM: CT ANGIOGRAPHY CHEST WITH CONTRAST TECHNIQUE: Multidetector CT imaging of the chest was performed using the standard protocol during bolus administration of intravenous contrast.  Multiplanar CT image reconstructions and MIPs were obtained to evaluate the vascular anatomy. RADIATION DOSE REDUCTION: This exam was performed according to the departmental dose-optimization program which includes  automated exposure control, adjustment of the mA and/or kV according to patient size and/or use of iterative reconstruction technique. CONTRAST:  75mL OMNIPAQUE IOHEXOL 350 MG/ML SOLN COMPARISON:  None Available. FINDINGS: Cardiovascular: Satisfactory opacification of the pulmonary arteries to the segmental level. No evidence of pulmonary embolism. Thoracic aorta is normal in course and caliber. Normal heart size. No pericardial effusion. Mediastinum/Nodes: No enlarged mediastinal, hilar, or axillary lymph nodes. Thyroid gland, trachea, and esophagus demonstrate no significant findings. Lungs/Pleura: Lungs are clear. No pleural effusion or pneumothorax. Upper Abdomen: See dedicated CT abdomen pelvis. Musculoskeletal: No acute osseous abnormality. Bilateral gynecomastia. Review of the MIP images confirms the above findings. IMPRESSION: No evidence of pulmonary embolism or other acute intrathoracic findings. Electronically Signed   By: Duanne Guess D.O.   On: 11/18/2023 17:00   CT ABDOMEN PELVIS W CONTRAST Result Date: 11/18/2023 CLINICAL DATA:  Abdominal pain, acute, nonlocalized acute onset severe epigastric pain EXAM: CT ABDOMEN AND PELVIS WITH CONTRAST TECHNIQUE: Multidetector CT imaging of the abdomen and pelvis was performed using the standard protocol following bolus administration of intravenous contrast. RADIATION DOSE REDUCTION: This exam was performed according to the departmental dose-optimization program which includes automated exposure control, adjustment of the mA and/or kV according to patient size and/or use of iterative reconstruction technique. CONTRAST:  75mL OMNIPAQUE IOHEXOL 350 MG/ML SOLN COMPARISON:  None Available. FINDINGS: Lower chest: See dedicated CT chest report. Hepatobiliary: No focal liver abnormality is seen. No gallstones, gallbladder wall thickening, or biliary dilatation. Pancreas: Unremarkable. No pancreatic ductal dilatation or surrounding inflammatory changes. Spleen:  Normal in size without focal abnormality. Adrenals/Urinary Tract: Adrenal glands are unremarkable. Kidneys are normal, without renal calculi, focal lesion, or hydronephrosis. Bladder is unremarkable. Stomach/Bowel: Stomach is within normal limits. Appendix appears normal. No evidence of bowel wall thickening, distention, or inflammatory changes. Vascular/Lymphatic: No significant vascular findings are present. No enlarged abdominal or pelvic lymph nodes. Reproductive: Prostate is unremarkable. Other: No free air or free fluid. Musculoskeletal: No acute or significant osseous findings. 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 IMPRESSION: 1. No acute abdominopelvic findings. 2. 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 Electronically Signed   By: Duanne Guess D.O.   On: 11/18/2023 16:56   DG Chest Portable 1 View Result Date: 11/18/2023 CLINICAL DATA:  Chest pain and dizziness. EXAM: PORTABLE CHEST 1 VIEW COMPARISON:  None Available. FINDINGS: The heart size and mediastinal contours are within normal limits. No focal consolidation, pleural effusion, or pneumothorax. No acute osseous abnormality. IMPRESSION: No acute cardiopulmonary findings. Electronically Signed   By: Hart Robinsons M.D.   On: 11/18/2023 15:16      LOS: 0 days    Jacquelin Hawking, MD [facility] 11/19/2023, 9:53 AM   If 7PM-7AM, please contact night-coverage [email or web address]

## 2023-11-19 NOTE — Anesthesia Preprocedure Evaluation (Signed)
 Anesthesia Evaluation  Patient identified by MRN, date of birth, ID band Patient awake    Reviewed: Allergy & Precautions, H&P , NPO status , Patient's Chart, lab work & pertinent test results  Airway Mallampati: II  TM Distance: >3 FB Neck ROM: Full    Dental no notable dental hx.    Pulmonary neg pulmonary ROS   Pulmonary exam normal breath sounds clear to auscultation       Cardiovascular Normal cardiovascular exam+ dysrhythmias Atrial Fibrillation  Rhythm:Regular Rate:Normal     Neuro/Psych negative neurological ROS  negative psych ROS   GI/Hepatic negative GI ROS, Neg liver ROS,,,  Endo/Other  negative endocrine ROS    Renal/GU negative Renal ROS  negative genitourinary   Musculoskeletal negative musculoskeletal ROS (+)    Abdominal  (+) + obese  Peds negative pediatric ROS (+)  Hematology negative hematology ROS (+)   Anesthesia Other Findings   Reproductive/Obstetrics negative OB ROS                             Anesthesia Physical Anesthesia Plan  ASA: 3  Anesthesia Plan: MAC   Post-op Pain Management: Minimal or no pain anticipated   Induction: Intravenous  PONV Risk Score and Plan: 1 and Propofol infusion and Treatment may vary due to age or medical condition  Airway Management Planned: Nasal Cannula  Additional Equipment:   Intra-op Plan:   Post-operative Plan:   Informed Consent: I have reviewed the patients History and Physical, chart, labs and discussed the procedure including the risks, benefits and alternatives for the proposed anesthesia with the patient or authorized representative who has indicated his/her understanding and acceptance.     Dental advisory given  Plan Discussed with: CRNA  Anesthesia Plan Comments:        Anesthesia Quick Evaluation

## 2023-11-19 NOTE — Plan of Care (Signed)

## 2023-11-19 NOTE — Progress Notes (Addendum)
     Transesophageal Echocardiogram Note  NEVAN CREIGHTON [id number] Jul 17, 1991  Procedure: Transesophageal Echocardiogram Indications: atrial flutter  Procedure Details Consent: Obtained Time Out: Verified patient identification, verified procedure, site/side was marked, verified correct patient position, special equipment/implants available, Radiology Safety Procedures followed,  medications/allergies/relevent history reviewed, required imaging and test results available.  Performed  Medications:  Pt sedated by anesthesia with diprovan 300 mg IV total.  Severe LV and RV dysfunction; moderate LAE; no LAA thrombus; mild MR and TR.  Note transesophageal echocardiogram was terminated early due to respiratory distress.  Saturations decreased into the 50s.  Improved to the 90s following cardioversion.  Pt subsequently underwent DCCV with 200 J; atrial flutter persisted; second attempt with 360J resulted in sinus rhythm.  No immediate complications.  Continue apixaban.   Complications: No apparent complications   Nasal trumpet attempted to be placed by anesthesia following procedure resulting in significant nosebleed.  Olga Millers, MD

## 2023-11-20 ENCOUNTER — Other Ambulatory Visit (HOSPITAL_COMMUNITY): Payer: Self-pay

## 2023-11-20 ENCOUNTER — Telehealth (HOSPITAL_COMMUNITY): Payer: Self-pay

## 2023-11-20 LAB — GLUCOSE, CAPILLARY: Glucose-Capillary: 80 mg/dL (ref 70–99)

## 2023-11-20 MED ORDER — LOSARTAN POTASSIUM 25 MG PO TABS
12.5000 mg | ORAL_TABLET | Freq: Every day | ORAL | 0 refills | Status: AC
  Filled 2023-11-20: qty 90, 180d supply, fill #0

## 2023-11-20 MED ORDER — METOPROLOL SUCCINATE ER 25 MG PO TB24
25.0000 mg | ORAL_TABLET | Freq: Every day | ORAL | 0 refills | Status: DC
  Filled 2023-11-20: qty 90, 90d supply, fill #0

## 2023-11-20 MED ORDER — AMIODARONE HCL 200 MG PO TABS
ORAL_TABLET | ORAL | 0 refills | Status: DC
  Filled 2023-11-20: qty 42, 36d supply, fill #0

## 2023-11-20 MED ORDER — APIXABAN 5 MG PO TABS
5.0000 mg | ORAL_TABLET | Freq: Two times a day (BID) | ORAL | 2 refills | Status: DC
  Filled 2023-11-20 – 2023-12-23 (×2): qty 60, 30d supply, fill #0

## 2023-11-20 NOTE — Progress Notes (Signed)
 Pt c/o not being able to sleep, MD updated and med ordered. SRP, RN

## 2023-11-20 NOTE — Telephone Encounter (Signed)
 Hello,  Donald Ruiz is a 33 y.o. male (MRN: [id number], DOB: 1991/03/08) who was recently hospitalized. They are anticipated to visit your clinic for post-discharge follow-up and may benefit from assistance with medication initiation and/or access.     Relevant medication access issues which may benefit from further intervention include: -Patient has no insurance which was identified at discharge. Transition of Care (TOC) pharmacy was able to use 30 day free card. Will need patient assistance set up as outpatient with possibly need for sample bridge pending approval status.   Please let us know if there is anything we can help you with at this time.       Thanks!   Thank you for allowing pharmacy to participate in this patient's care,  Sherron Monday, PharmD, BCCCP Clinical Pharmacist  Phone: [phone] 11/20/2023 1:41 PM  Please check AMION for all [facility] phone numbers After 10:00 PM, call Main Pharmacy [phone]

## 2023-11-20 NOTE — Progress Notes (Signed)
 Patient verbalized understanding of dc instructions. All paperwork and belongings given to patient.

## 2023-11-20 NOTE — Discharge Summary (Addendum)
 Physician Discharge Summary   Patient: Donald Ruiz DOB: 06-Jul-1991  Admit date:     11/18/2023  Discharge date: 11/20/23  Discharge Physician: Jacquelin Hawking, MD   PCP: No primary care provider on file.   Recommendations at discharge:  PCP establishment Follow-up with cardiology  Discharge Diagnoses: Principal Problem:   Chest pain, rule out acute myocardial infarction Active Problems:   Atrial flutter with rapid ventricular response (HCC)   Elevated serum creatinine   Elevated glucose   Atrial flutter (HCC)  Resolved Problems:   * No resolved hospital problems. *  Hospital Course: Donald Ruiz is a 33 y.o. male with no known medical history.  Patient presented secondary to chest pain and was found to have atrial flutter with rapid ventricular response. Cardiology consulted. Patient managed on metoprolol with plan for Transesophageal Echocardiogram DCCV.  Assessment and Plan:  Chest pain Initial concern for possible NSTEMI. Troponin peak of 204 with EKG significant for atrial flutter with RVR. Chest pain significantly improved since yesterday. Likely mediated by rapid flutter. Resolved prior to discharge.   Atrial flutter with rapid ventricular response New diagnosis. Cardiology consulted. Patient managed on metoprolol and cardiology performed a successful Transesophageal Echocardiogram/DCCV. Patient discharged on Eliquis and metoprolol succinate.   AKI Mild. Baseline creatinine of 1.3. Creatinine up to 1.8 but improved to baseline after IV fluids.   Hyperglycemia Unclear etiology. Hemoglobin A1C of 5.5%.  New HFrEF No evidence of acute heart failure symptoms. Noted on Transthoracic Echocardiogram. LVEF of less than 20%. Possibly tachycardia induced. Cardiology recommendation for outpatient follow-up.   Consultants: Cardiology Procedures performed:  Transesophageal Echocardiogram/DCCV   Disposition: Home Diet recommendation: Cardiac  diet  DISCHARGE MEDICATION: Allergies as of 11/20/2023   No Known Allergies      Medication List     TAKE these medications    acetaminophen 500 MG tablet Commonly known as: TYLENOL Take 1,000 mg by mouth daily as needed for headache or moderate pain (pain score 4-6).   amiodarone 200 MG tablet Commonly known as: PACERONE Take 1 tablet (200 mg total) by mouth 2 (two) times daily for 6 days, THEN 1 tablet (200 mg total) daily. Start taking on: November 20, 2023   apixaban 5 MG Tabs tablet Commonly known as: ELIQUIS Take 1 tablet (5 mg total) by mouth 2 (two) times daily.   losartan 25 MG tablet Commonly known as: COZAAR Take 0.5 tablets (12.5 mg total) by mouth daily. Start taking on: November 21, 2023   metoprolol succinate 25 MG 24 hr tablet Commonly known as: TOPROL-XL Take 1 tablet (25 mg total) by mouth daily. Start taking on: November 21, 2023        Follow-up Information     [facility] Follow up.   Specialty: Internal Medicine Why: Call Monday to establish care with medical provider Contact information: [address] [place] [address] [phone]        Laurey Morale, MD Follow up on 11/25/2023.   Specialty: Cardiology Why: 1:30 PM, For hospital follow-up Contact information: [address] [place] [address] [phone]                Discharge Exam: BP 127/89   Pulse 78   Temp 98.1 F (36.7 C) (Oral)   Resp 18   Ht 6\' 6"  (1.981 m)   Wt (!) 143 kg   SpO2 91%   BMI 36.43 kg/m   General exam:  Appears calm and comfortable Respiratory system: Clear to auscultation. Respiratory effort normal. Cardiovascular system: S1 & S2 heard, RRR. No murmurs. Gastrointestinal system: Abdomen is nondistended, soft and nontender. No organomegaly or masses felt. Normal bowel sounds heard. Central nervous system: Alert and oriented. No focal neurological deficits. Musculoskeletal: No edema. No  calf tenderness Skin: No cyanosis. No rashes Psychiatry: Judgement and insight appear normal. Mood & affect appropriate.   Condition at discharge: stable  The results of significant diagnostics from this hospitalization (including imaging, microbiology, ancillary and laboratory) are listed below for reference.   Imaging Studies: ECHOCARDIOGRAM COMPLETE Result Date: 11/19/2023    ECHOCARDIOGRAM REPORT   Patient Name:   Donald Ruiz Date of Exam: 11/19/2023 Medical Rec #:  [id number]         Height:       78.0 in Accession #:    [id number]        Weight:       315.3 lb Date of Birth:  1991-04-18        BSA:          2.739 m Patient Age:    32 years          BP:           107/52 mmHg Patient Gender: M                 HR:           97 bpm. Exam Location:  Inpatient Procedure: 2D Echo, Cardiac Doppler and Color Doppler (Both Spectral and Color            Flow Doppler were utilized during procedure). Indications:    Atrial Flutter I48.92  History:        Patient has no prior history of Echocardiogram examinations.                 Arrythmias:Atrial Flutter; Signs/Symptoms:Chest Pain.  Sonographer:    Lucendia Herrlich RCS Referring Phys: [id number] Cecille Po MELVIN IMPRESSIONS  1. Left ventricular ejection fraction, by estimation, is <20%. The left ventricle has severely decreased function. The left ventricle demonstrates global hypokinesis. The left ventricular internal cavity size was moderately to severely dilated. Left ventricular diastolic function could not be evaluated due to atrial flutter.  2. Right ventricular systolic function is normal. The right ventricular size is normal.  3. The mitral valve is normal in structure. Mild mitral valve regurgitation. No evidence of mitral stenosis.  4. The aortic valve is tricuspid. Aortic valve regurgitation is not visualized. No aortic stenosis is present.  5. The inferior vena cava is normal in size with greater than 50% respiratory variability, suggesting right  atrial pressure of 3 mmHg. FINDINGS  Left Ventricle: Left ventricular ejection fraction, by estimation, is <20%. The left ventricle has severely decreased function. The left ventricle demonstrates global hypokinesis. The left ventricular internal cavity size was moderately to severely dilated. There is no left ventricular hypertrophy. Left ventricular diastolic function could not be evaluated due to atrial flutter. Right Ventricle: The right ventricular size is normal. No increase in right ventricular wall thickness. Right ventricular systolic function is normal. Left Atrium: Left atrial size was normal in size. Right Atrium: Right atrial size was normal in size. Pericardium: There is no evidence of pericardial effusion. Mitral Valve: The mitral valve is normal in structure. Mild mitral valve regurgitation. No evidence of mitral valve stenosis. Tricuspid Valve: The tricuspid valve is normal in structure. Tricuspid valve regurgitation is trivial. No evidence  of tricuspid stenosis. Aortic Valve: The aortic valve is tricuspid. Aortic valve regurgitation is not visualized. No aortic stenosis is present. Aortic valve peak gradient measures 6.8 mmHg. Pulmonic Valve: The pulmonic valve was normal in structure. Pulmonic valve regurgitation is mild. No evidence of pulmonic stenosis. Aorta: The aortic root and ascending aorta are structurally normal, with no evidence of dilitation. Ascending aorta measurements are within normal limits for age when indexed to body surface area. Venous: The inferior vena cava is normal in size with greater than 50% respiratory variability, suggesting right atrial pressure of 3 mmHg. IAS/Shunts: The interatrial septum was not well visualized.  LEFT VENTRICLE PLAX 2D LVIDd:         6.90 cm      Diastology LVIDs:         6.10 cm      LV e' medial:    9.36 cm/s LV PW:         1.20 cm      LV E/e' medial:  10.5 LV IVS:        1.05 cm      LV e' lateral:   10.20 cm/s LVOT diam:     2.50 cm      LV  E/e' lateral: 9.6 LV SV:         61 LV SV Index:   22 LVOT Area:     4.91 cm  LV Volumes (MOD) LV vol d, MOD A2C: 136.0 ml LV vol d, MOD A4C: 121.0 ml LV vol s, MOD A2C: 115.0 ml LV vol s, MOD A4C: 114.0 ml LV SV MOD A2C:     21.0 ml LV SV MOD A4C:     121.0 ml LV SV MOD BP:      8.8 ml RIGHT VENTRICLE             IVC RV S prime:     12.30 cm/s  IVC diam: 1.80 cm TAPSE (M-mode): 2.3 cm LEFT ATRIUM             Index        RIGHT ATRIUM           Index LA diam:        4.50 cm 1.64 cm/m   RA Area:     23.10 cm LA Vol (A2C):   81.4 ml 29.72 ml/m  RA Volume:   78.60 ml  28.69 ml/m LA Vol (A4C):   86.7 ml 31.65 ml/m LA Biplane Vol: 86.5 ml 31.58 ml/m  AORTIC VALVE AV Area (Vmax): 3.04 cm AV Vmax:        130.00 cm/s AV Peak Grad:   6.8 mmHg LVOT Vmax:      80.57 cm/s LVOT Vmean:     55.200 cm/s LVOT VTI:       0.125 m  AORTA Ao Root diam: 4.10 cm Ao Asc diam:  3.40 cm MITRAL VALVE MV Area (PHT): 5.66 cm    SHUNTS MV Decel Time: 134 msec    Systemic VTI:  0.12 m MR Peak grad: 76.7 mmHg    Systemic Diam: 2.50 cm MR Vmax:      438.00 cm/s MV E velocity: 98.00 cm/s MV A velocity: 44.50 cm/s MV E/A ratio:  2.20 Sunit Tolia Electronically signed by Tessa Lerner Signature Date/Time: 11/19/2023/7:54:08 PM    Final    ECHO TEE Result Date: 11/19/2023    TRANSESOPHOGEAL ECHO REPORT   Patient Name:   VADIM CENTOLA Date of Exam: 11/19/2023 Medical Rec #:  Ruiz         Height:       78.0 in Accession #:    [id number]        Weight:       315.3 lb Date of Birth:  07-Dec-1990        BSA:          2.739 m Patient Age:    32 years          BP:           123/83 mmHg Patient Gender: M                 HR:           130 bpm. Exam Location:  Inpatient Procedure: Transesophageal Echo, Cardiac Doppler and Color Doppler (Both            Spectral and Color Flow Doppler were utilized during procedure). Indications:     Cardioversion  History:         Patient has prior history of Echocardiogram examinations, most                   recent 11/19/2023.  Sonographer:     Harriette Bouillon RDCS Referring Phys:  [id number] BRIAN S CRENSHAW Diagnosing Phys: Olga Millers MD PROCEDURE: The transesophogeal probe was passed without difficulty through the esophogus of the patient. Sedation performed by different physician. The patient was monitored while under deep sedation. Anesthestetic sedation was provided intravenously by Anesthesiology: 300mg  of Propofol. The patient developed Respiratory depression during the procedure. A successful direct current cardioversion was performed at 360 joules with 2 attempts. First attempt with 200 joules unsuccessful.  IMPRESSIONS  1. Study terminated early due to respiratory distress and hypoxemia (Saturation in 79s).  2. Left ventricular ejection fraction, by estimation, is <20%. The left ventricle has severely decreased function. The left ventricle demonstrates global hypokinesis. The left ventricular internal cavity size was moderately dilated.  3. Right ventricular systolic function is severely reduced. The right ventricular size is normal.  4. Left atrial size was severely dilated. No left atrial/left atrial appendage thrombus was detected.  5. Right atrial size was mildly dilated.  6. The mitral valve is normal in structure. Mild mitral valve regurgitation.  7. The aortic valve is tricuspid. Aortic valve regurgitation is not visualized.  8. There is borderline dilatation of the aortic root, measuring 38 mm.  9. First attempt with 200 joules unsuccessful. FINDINGS  Left Ventricle: Left ventricular ejection fraction, by estimation, is <20%. The left ventricle has severely decreased function. The left ventricle demonstrates global hypokinesis. The left ventricular internal cavity size was moderately dilated. Right Ventricle: The right ventricular size is normal. Right ventricular systolic function is severely reduced. Left Atrium: Left atrial size was severely dilated. No left atrial/left atrial appendage thrombus was  detected. Right Atrium: Right atrial size was mildly dilated. Pericardium: Trivial pericardial effusion is present. Mitral Valve: The mitral valve is normal in structure. Mild mitral valve regurgitation. Tricuspid Valve: The tricuspid valve is normal in structure. Tricuspid valve regurgitation is mild. Aortic Valve: The aortic valve is tricuspid. Aortic valve regurgitation is not visualized. Pulmonic Valve: The pulmonic valve was normal in structure. Pulmonic valve regurgitation is not visualized. Aorta: The aortic root is normal in size and structure. There is borderline dilatation of the aortic root, measuring 38 mm. IAS/Shunts: No atrial level shunt detected by color flow Doppler. Additional Comments: Study terminated early due to respiratory distress and hypoxemia (Saturation in  50s). Olga Millers MD Electronically signed by Olga Millers MD Signature Date/Time: 11/19/2023/1:00:04 PM    Final    EP STUDY Result Date: 11/19/2023 See surgical note for result.  CT Angio Chest PE W and/or Wo Contrast Result Date: 11/18/2023 CLINICAL DATA:  Pulmonary embolism (PE) suspected, high prob acute onset CP, SOB EXAM: CT ANGIOGRAPHY CHEST WITH CONTRAST TECHNIQUE: Multidetector CT imaging of the chest was performed using the standard protocol during bolus administration of intravenous contrast. Multiplanar CT image reconstructions and MIPs were obtained to evaluate the vascular anatomy. RADIATION DOSE REDUCTION: This exam was performed according to the departmental dose-optimization program which includes automated exposure control, adjustment of the mA and/or kV according to patient size and/or use of iterative reconstruction technique. CONTRAST:  75mL OMNIPAQUE IOHEXOL 350 MG/ML SOLN COMPARISON:  None Available. FINDINGS: Cardiovascular: Satisfactory opacification of the pulmonary arteries to the segmental level. No evidence of pulmonary embolism. Thoracic aorta is normal in course and caliber. Normal heart size. No  pericardial effusion. Mediastinum/Nodes: No enlarged mediastinal, hilar, or axillary lymph nodes. Thyroid gland, trachea, and esophagus demonstrate no significant findings. Lungs/Pleura: Lungs are clear. No pleural effusion or pneumothorax. Upper Abdomen: See dedicated CT abdomen pelvis. Musculoskeletal: No acute osseous abnormality. Bilateral gynecomastia. Review of the MIP images confirms the above findings. IMPRESSION: No evidence of pulmonary embolism or other acute intrathoracic findings. Electronically Signed   By: Duanne Guess D.O.   On: 11/18/2023 17:00   CT ABDOMEN PELVIS W CONTRAST Result Date: 11/18/2023 CLINICAL DATA:  Abdominal pain, acute, nonlocalized acute onset severe epigastric pain EXAM: CT ABDOMEN AND PELVIS WITH CONTRAST TECHNIQUE: Multidetector CT imaging of the abdomen and pelvis was performed using the standard protocol following bolus administration of intravenous contrast. RADIATION DOSE REDUCTION: This exam was performed according to the departmental dose-optimization program which includes automated exposure control, adjustment of the mA and/or kV according to patient size and/or use of iterative reconstruction technique. CONTRAST:  75mL OMNIPAQUE IOHEXOL 350 MG/ML SOLN COMPARISON:  None Available. FINDINGS: Lower chest: See dedicated CT chest report. Hepatobiliary: No focal liver abnormality is seen. No gallstones, gallbladder wall thickening, or biliary dilatation. Pancreas: Unremarkable. No pancreatic ductal dilatation or surrounding inflammatory changes. Spleen: Normal in size without focal abnormality. Adrenals/Urinary Tract: Adrenal glands are unremarkable. Kidneys are normal, without renal calculi, focal lesion, or hydronephrosis. Bladder is unremarkable. Stomach/Bowel: Stomach is within normal limits. Appendix appears normal. No evidence of bowel wall thickening, distention, or inflammatory changes. Vascular/Lymphatic: No significant vascular findings are present. No  enlarged abdominal or pelvic lymph nodes. Reproductive: Prostate is unremarkable. Other: No free air or free fluid. Musculoskeletal: No acute or significant osseous findings. 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 IMPRESSION: 1. No acute abdominopelvic findings. 2. 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 Electronically Signed   By: Duanne Guess D.O.   On: 11/18/2023 16:56   DG Chest Portable 1 View Result Date: 11/18/2023 CLINICAL DATA:  Chest pain and dizziness. EXAM: PORTABLE CHEST 1 VIEW COMPARISON:  None Available. FINDINGS: The heart size and mediastinal contours are within normal limits. No focal consolidation, pleural effusion, or pneumothorax. No acute osseous abnormality. IMPRESSION: No acute cardiopulmonary findings. Electronically Signed   By: Hart Robinsons M.D.   On: 11/18/2023 15:16    Microbiology: No results found for this or any previous visit.  Labs: CBC: Recent Labs  Lab 11/18/23 1311 11/19/23 0331  WBC 8.6 8.5  HGB 16.3 14.9  HCT 49.0 44.8  MCV  86.9 87.3  PLT 407* 333   Basic Metabolic Panel: Recent Labs  Lab 11/18/23 1311 11/18/23 1912 11/19/23 0331  NA 137  --  137  K 4.0  --  4.0  CL 102  --  107  CO2 20*  --  24  GLUCOSE 233*  --  98  BUN 17  --  19  CREATININE 1.88*  --  1.30*  CALCIUM 9.2  --  8.6*  MG  --  1.9  --    Liver Function Tests: Recent Labs  Lab 11/18/23 1311 11/19/23 0331  AST 37 27  ALT 31 25  ALKPHOS 71 58  BILITOT 0.9 0.7  PROT 7.5 6.5  ALBUMIN 4.1 3.5   CBG: Recent Labs  Lab 11/19/23 0043 11/19/23 0759 11/19/23 0829 11/19/23 1301 11/20/23 0558  GLUCAP 98 69* 167* 112* 80    Discharge time spent: 35 minutes.  Signed: Jacquelin Hawking, MD [facility] 11/20/2023

## 2023-11-25 ENCOUNTER — Telehealth: Payer: Self-pay | Admitting: Internal Medicine

## 2023-11-25 ENCOUNTER — Other Ambulatory Visit (HOSPITAL_COMMUNITY): Payer: Self-pay | Admitting: *Deleted

## 2023-11-25 ENCOUNTER — Ambulatory Visit (HOSPITAL_COMMUNITY)
Admit: 2023-11-25 | Discharge: 2023-11-25 | Disposition: A | Discharge status: Home / Self Care | Payer: Self-pay | Source: Ambulatory Visit | Attending: Cardiology | Admitting: Cardiology

## 2023-11-25 VITALS — BP 108/80 | HR 67 | Wt 309.4 lb

## 2023-11-25 DIAGNOSIS — I4892 Unspecified atrial flutter: Secondary | ICD-10-CM

## 2023-11-25 DIAGNOSIS — I483 Typical atrial flutter: Secondary | ICD-10-CM

## 2023-11-25 DIAGNOSIS — Z79899 Other long term (current) drug therapy: Secondary | ICD-10-CM | POA: Insufficient documentation

## 2023-11-25 DIAGNOSIS — Z7901 Long term (current) use of anticoagulants: Secondary | ICD-10-CM | POA: Insufficient documentation

## 2023-11-25 DIAGNOSIS — I5022 Chronic systolic (congestive) heart failure: Secondary | ICD-10-CM | POA: Insufficient documentation

## 2023-11-25 MED ORDER — SPIRONOLACTONE 25 MG PO TABS: 12.5000 mg | ORAL_TABLET | Freq: Every day | ORAL | 3 refills | Status: DC

## 2023-11-25 NOTE — Progress Notes (Signed)
 PCP: No primary care provider on file. HF Cardiology: Dr. Shirlee Latch   33 y.o. with history of atrial flutter and cardiomyopathy was referred by Dr. Tenny Craw for evaluation of CHF.  Patient had no known cardiac history prior to 3/25.  However, he has had some exertional symptoms for the last 2 years.  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  After that, he began to notice dyspnea and a sensation of his heart racing with heavier exertion such as playing basketball. For the last year, he has noted dyspnea carrying groceries up the stairs. He will also note his heart racing and mild chest tightness.  He did not see a doctor with these symptoms, just attributed it to weight gain.  About a week prior to his 3/25 admission, symptoms worsened.  He developed a fast heart rate that would not resolve (in the past, it would not last long).  He also reported orthopnea.  He developed chest tightness.  He went to the ER and was found to be in typical atrial flutter with RVR.  Troponin was mildly elevated without significant trend, thought to be demand ischemia.  Echo was done showing EF < 20%, global hypokinesis, mod-severe LV dilation, normal RV, mild MR. He was started on po amiodarone and apixaban and underwent TEE-guided DCCV back to NSR and was discharged home.   Since arriving home, he has felt "fine."  He has not noticed his heart racing. He notes dyspnea after walking 5-10 minutes.  He has had no more chest pain or orthopnea.  No lightheadedness/dizziness/syncope.   He does report snoring and daytime sleepiness. He does not drink ETOH or smoke cigarettes.  He does smoke marijuana. Multiple relatives on his father's side have had atrial flutter or fibrillation.  No history of cardiomyopathy that he knows of. Today, he is noted to be back in atrial fibrillation with RVR (typical).  He does not feel it.   ECG (personally reviewed): Typical atrial flutter with RVR 134 msec  Labs (3/25):  Creatinine 1.88 => 1.3, LDL 101, HIV negative, HS-TnI 20 => 162, TSH normal, UDS +THC, Mg 1.9  PMH: 1. Atrial flutter: Typical.  DCCV to NSR in 3/25 but recurred by 11/25/23.  2. Chronic systolic CHF: Echo (3/25) with EF < 20%, global hypokinesis, mod-severe LV dilation, normal RV, mild MR.  - TEE (3/25): EF < 20%, moderate LV dilation, severe RV dysfunction, mild MR.   FH: Father's family with multiple people with atrial flutter/fibrillation. No history of cardiomyopathy that he knows.   SH: Lives in [place] with fiance, 2 children, works in roofing, no ETOH, no smoking but uses marijuana.  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   ROS: All systems reviewed and negative except as per HPI.   Current Outpatient Medications  Medication Sig Dispense Refill   acetaminophen (TYLENOL) 500 MG tablet Take 1,000 mg by mouth daily as needed for headache or moderate pain (pain score 4-6).     amiodarone (PACERONE) 200 MG tablet Take 1 tablet (200 mg total) by mouth 2 (two) times daily for 6 days, THEN 1 tablet (200 mg total) daily. 42 tablet 0   apixaban (ELIQUIS) 5 MG TABS tablet Take 1 tablet (5 mg total) by mouth 2 (two) times daily. 60 tablet 2   losartan (COZAAR) 25 MG tablet Take 0.5 tablets (12.5 mg total) by mouth daily. 90 tablet 0   metoprolol succinate (TOPROL-XL) 25 MG 24 hr tablet Take 1 tablet (25  mg total) by mouth daily. 90 tablet 0   spironolactone (ALDACTONE) 25 MG tablet Take 0.5 tablets (12.5 mg total) by mouth daily. 15 tablet 3   No current facility-administered medications for this encounter.   BP 108/80   Pulse 67   Wt (!) 140.3 kg (309 lb 6.4 oz)   SpO2 97%   BMI 35.75 kg/m  General: NAD Neck: No JVD, no thyromegaly or thyroid nodule.  Lungs: Clear to auscultation bilaterally with normal respiratory effort. CV: Nondisplaced PMI.  Heart tachycardic, regular S1/S2, no S3/S4, no murmur.  No peripheral edema.  No carotid bruit.  Normal pedal pulses.  Abdomen: Soft,  nontender, no hepatosplenomegaly, no distention.  Skin: Intact without lesions or rashes.  Neurologic: Alert and oriented x 3.  Psych: Normal affect. Extremities: No clubbing or cyanosis.  HEENT: Normal.   Assessment/Plan: 1. Atrial flutter: Typical.  Noted when he was admitted in 3/25.  Uncertain how long he had been in atrial flutter prior to admission.  He was started on po amiodarone and apixaban and underwent TEE-guided DCCV back to NSR, but today he is noted to be back in atrial flutter with RVR.  He does not currently feel palpitations.  This makes me wonder whether the atrial flutter had been present for a relatively long time prior to last admission. He needs to get back into NSR given concern for tachycardia-mediated cardiomyopathy.  - Continue Eliquis.  - Increase amiodarone to 400 mg bid for now.  - Continue Toprol XL 25 mg daily.  - I will arrange for DCCV early next week.  Discussed risks/benefits with patient and he agrees with the procedure.  However, I will also speak with EP colleagues to see if rather than DCCV, we can go straight to flutter ablation next week. If flutter ablation is possible next week, I will cancel DCCV.  2. Chronic systolic CHF: Echo in 3/25 with EF < 20%, global hypokinesis, mod-severe LV dilation, normal RV, mild MR.  Question in this situation is whether this is a tachycardia-mediated cardiomyopathy (he was in atrial flutter an unknown period of time prior to admission) or whether an underlying cardiomyopathy triggered the atrial flutter. To answer this question, we will need to get him back into NSR and then get an echo in a couple of months. He is not volume overloaded on exam.  NYHA class II symptoms.  - As above, he needs to get back into NSR.  Repeat echo at 2-3 months in NSR.  - Continue Toprol XL 25 mg daily.  - Continue losartan 12.5 daily, eventually transition to [facility].  - Add spironolactone 12.5 mg daily, BMET in 1 week.  - Eventual SGLT2  inhibitor, wait until after atrial flutter ablation.  - I will arrange for a cardiac MRI to look for infiltrative disease.  - If EF does not improve in NSR, he will need right/left heart catheterization in the future.  3. Suspect OSA: I will arrange for home sleep study.   I spent 57 minutes reviewing records, interviewing/examining patient, and managing orders.   Marca Ancona 11/25/2023

## 2023-11-25 NOTE — Telephone Encounter (Signed)
 Received a page to call his significant other regarding spironolactone medication.  She is concerned that medication was not sent to pharmacy she was told, and she was told that medication needs to be started tonight.  I updated her about the location of the prescription and reassured her that it is okay for medication to be started in the AM when it can be picked up from pharmacy.

## 2023-11-25 NOTE — Patient Instructions (Signed)
 Great to see you today!!!  Medication Changes:  START Spironolactone 12.5 mg (1/2 tab) Daily  INCREASE Amiodarone to 400 mg (2 tabs) Twice daily   Lab Work:  Your physician recommends that you return for lab work in: 10 days  Testing/Procedures:  Your physician has recommended that you have a Cardioversion (DCCV). Electrical Cardioversion uses a jolt of electricity to your heart either through paddles or wired patches attached to your chest. This is a controlled, usually prescheduled, procedure. Defibrillation is done under light anesthesia in the hospital, and you usually go home the day of the procedure. This is done to get your heart back into a normal rhythm. You are not awake for the procedure. Please see the instructions below.  Your physician has recommended that you have a sleep study. This test records several body functions during sleep, including: brain activity, eye movement, oxygen and carbon dioxide blood levels, heart rate and rhythm, breathing rate and rhythm, the flow of air through your mouth and nose, snoring, body muscle movements, and chest and belly movement.  Your physician has requested that you have a cardiac MRI. Cardiac MRI uses a computer to create images of your heart as its beating, producing both still and moving pictures of your heart and major blood vessels. For further information please see instructions below  Special Instructions // Education:  Do the following things EVERYDAY: Weigh yourself in the morning before breakfast. Write it down and keep it in a log. Take your medicines as prescribed Eat low salt foods--Limit salt (sodium) to 2000 mg per day.  Stay as active as you can everyday Limit all fluids for the day to less than 2 liters    CARDIOVERSION INSTRUCTIONS:  You are scheduled for a Cardioversion on Tuesday, April 8 with Dr. Shirlee Latch.    Please arrive at the [facility] (Main Entrance A) at [facility]: [address]  [place], [place] [address] at 10:00 AM (This time is 1 hour(s) before your procedure to ensure your preparation).   Free valet parking service is available. You will check in at ADMITTING.   *Please Note: You will receive a call the day before your procedure to confirm the appointment time. That time may have changed from the original time based on the schedule for that day.*    DIET:  Nothing to eat or drink after midnight except a sip of water with medications (see medication instructions below)  MEDICATION INSTRUCTIONS: !!IF ANY NEW MEDICATIONS ARE STARTED AFTER TODAY, PLEASE NOTIFY YOUR PROVIDER AS SOON AS POSSIBLE!!  FYI: Medications such as Semaglutide (Ozempic, [place]), Tirzepatide (Mounjaro, Zepbound), Dulaglutide (Trulicity), etc ("GLP1 agonists") AND Canagliflozin (Invokana), Dapagliflozin (Farxiga), Empagliflozin (Jardiance), Ertugliflozin (Steglatro), Bexagliflozin [employer]) or any combination with one of these drugs such as Invokamet (Canagliflozin/Metformin), Synjardy (Empagliflozin/Metformin), etc ("SGLT2 inhibitors") must be held around the time of a procedure. This is not a comprehensive list of all of these drugs. Please review all of your medications and talk to your provider if you take any one of these. If you are not sure, ask your provider.     Continue taking your anticoagulant (blood thinner): Apixaban (Eliquis).    LABS: NOT NEEDED    FYI:  For your safety, and to allow [place] to monitor your vital signs accurately during the surgery/procedure we request: If you have artificial nails, gel coating, SNS etc, please have those removed prior to your surgery/procedure. Not having the nail coverings /polish removed may result in cancellation or delay of  your surgery/procedure.  Your support person will be asked to wait in the waiting room during your procedure.  It is OK to have someone drop you off and come back when you are ready to be discharged.  You cannot drive after the  procedure and will need someone to drive you home.  Bring your insurance cards.  *Special Note: Every effort is made to have your procedure done on time. Occasionally there are emergencies that occur at the hospital that may cause delays. Please be patient if a delay does occur.     Follow-Up in: 3 WEEKS   At the [facility], you and your health needs are our priority. We have a designated team specialized in the treatment of Heart Failure. This Care Team includes your primary Heart Failure Specialized Cardiologist (physician), Advanced Practice Providers (APPs- Physician Assistants and Nurse Practitioners), and Pharmacist who all work together to provide you with the care you need, when you need it.   You may see any of the following providers on your designated Care Team at your next follow up:  Dr. Arvilla Meres Dr. Marca Ancona Dr. Dorthula Nettles Dr. Theresia Bough Tonye Becket, NP Robbie Lis, [place] [facility] [place], [place] Brynda Peon, NP [place] Lee, NP Karle Plumber, PharmD   Please be sure to bring in all your medications bottles to every appointment.   Need to Contact us:  If you have any questions or concerns before your next appointment please send [place] a message through [place] or call our office at [phone].    TO LEAVE A MESSAGE FOR THE NURSE SELECT OPTION 2, PLEASE LEAVE A MESSAGE INCLUDING: YOUR NAME DATE OF BIRTH CALL BACK NUMBER REASON FOR CALL**this is important as we prioritize the call backs  YOU WILL RECEIVE A CALL BACK THE SAME DAY AS LONG AS YOU CALL BEFORE 4:00 PM

## 2023-11-29 ENCOUNTER — Encounter: Payer: Self-pay | Admitting: Cardiology

## 2023-11-29 ENCOUNTER — Ambulatory Visit: Payer: Self-pay | Attending: Cardiology | Admitting: Cardiology

## 2023-11-29 VITALS — BP 114/82 | HR 93 | Ht 78.0 in | Wt 311.0 lb

## 2023-11-29 DIAGNOSIS — I483 Typical atrial flutter: Secondary | ICD-10-CM

## 2023-11-29 DIAGNOSIS — I5022 Chronic systolic (congestive) heart failure: Secondary | ICD-10-CM

## 2023-11-29 MED ORDER — METOPROLOL SUCCINATE ER 50 MG PO TB24: 50.0000 mg | ORAL_TABLET | Freq: Every day | ORAL | 11 refills | Status: DC

## 2023-11-29 NOTE — Patient Instructions (Addendum)
 Medication Instructions:  Your physician has recommended you make the following change in your medication:  INCREASE Metoprolol to 50 mg daily  *If you need a refill on your cardiac medications before your next appointment, please call your pharmacy*   Lab Work: None ordered   Testing/Procedures: None ordered   Follow-Up: At [facility], you and your health needs are our priority.  As part of our continuing mission to provide you with exceptional heart care, we have created designated Provider Care Teams.  These Care Teams include your primary Cardiologist (physician) and Advanced Practice Providers (APPs -  Physician Assistants and Nurse Practitioners) who all work together to provide you with the care you need, when you need it.  We recommend signing up for the patient portal called "MyChart".  Sign up information is provided on this After Visit Summary.  MyChart is used to connect with patients for Virtual Visits (Telemedicine).  Patients are able to view lab/test results, encounter notes, upcoming appointments, etc.  Non-urgent messages can be sent to your provider as well.   To learn more about what you can do with MyChart, go to [email or web address].    Your next appointment:   To be  determined  The format for your next appointment:   In Person  Provider:   Loman Brooklyn, MD    Thank you for choosing [facility]!!   Dory Horn, RN [phone]

## 2023-11-29 NOTE — Progress Notes (Signed)
 Electrophysiology Office Note:   Date:  11/29/2023  ID:  Donald Ruiz, DOB 06-10-1991, MRN [id number]  Primary Cardiologist: Dietrich Pates, MD Primary Heart Failure: Marca Ancona, MD Electrophysiologist: Regan Lemming, MD      History of Present Illness:   Donald Ruiz is a 33 y.o. male with h/o atrial flutter, chronic systolic heart failure seen today for  for Electrophysiology evaluation of atrial flutter at the request of Sunnie Nielsen  He had no known cardiac history prior to March 5.  He began to have some exertional symptoms over the last 2 years.  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  He retired at age 79.  He began to notice dyspnea and a sensation of heart racing with heavy exertion such as playing basketball.  For the last year he noted dyspnea while carrying groceries upstairs.  He notes heart racing and mild chest tightness.  He was admitted to the hospital 11/18/2023 with symptoms of orthopnea.  He also developed chest tightness.  In the ER he was found to be in typical atrial flutter.  Echo showed an ejection fraction of less than 20% with global hypokinesis.  He was started on amiodarone and Eliquis.  He underwent TEE and cardioversion and was discharged home.  He presented to general cardiology clinic feeling well.  Unfortunately he was noted to be back in atrial flutter.  Patient was unaware.  Today, denies symptoms of palpitations, chest pain, shortness of breath, orthopnea, PND, lower extremity edema, claudication, dizziness, presyncope, syncope, bleeding, or neurologic sequela. The patient is tolerating medications without difficulties.    Review of systems complete and found to be negative unless listed in HPI.   EP Information / Studies Reviewed:    EKG is ordered today. Personal review as below.  EKG Interpretation Date/Time:  Monday November 29 2023 15:54:02 EDT Ventricular Rate:  93 PR Interval:    QRS Duration:  92 QT  Interval:  380 QTC Calculation: 472 R Axis:   120  Text Interpretation: Atrial flutter with variable A-V block Left posterior fascicular block When compared with ECG of 25-Nov-2023 13:54, No significant change since last tracing Confirmed by Makylah Bossard ([id number]) on 11/29/2023 4:03:07 PM     Risk Assessment/Calculations:    CHA2DS2-VASc Score = 1   This indicates a 0.6% annual risk of stroke. The patient's score is based upon: CHF History: 1 HTN History: 0 Diabetes History: 0 Stroke History: 0 Vascular Disease History: 0 Age Score: 0 Gender Score: 0             Physical Exam:   VS:  BP 114/82   Pulse 93   Ht 6\' 6"  (1.981 m)   Wt (!) 311 lb (141.1 kg)   SpO2 96%   BMI 35.94 kg/m    Wt Readings from Last 3 Encounters:  11/29/23 (!) 311 lb (141.1 kg)  11/25/23 (!) 309 lb 6.4 oz (140.3 kg)  11/19/23 (!) 315 lb 4.1 oz (143 kg)     GEN: Well nourished, well developed in no acute distress NECK: No JVD; No carotid bruits CARDIAC: Irregularly irregular rate and rhythm, no murmurs, rubs, gallops RESPIRATORY:  Clear to auscultation without rales, wheezing or rhonchi  ABDOMEN: Soft, non-tender, non-distended EXTREMITIES:  No edema; No deformity   ASSESSMENT AND PLAN:    1.  Typical atrial flutter: Currently on amiodarone 400 mg twice daily, Eliquis 5 mg daily.  He unfortunately remains in atrial flutter.  He is  minimally symptomatic.  With his heart failure history, he would benefit from ablation.  Risk and benefits have been discussed.  He understands the risks and is agreed to the procedure.  During the procedure, we Landin Tallon plan for ILR implantation.  Risk, benefits, and alternatives to EP study and radiofrequency/pulse field ablation for atrial flutter were also discussed in detail today. These risks include but are not limited to stroke, bleeding, vascular damage, tamponade, perforation, damage to the esophagus, lungs, and other structures, pulmonary vein stenosis, worsening  renal function, and death. The patient understands these risk and wishes to proceed.   2.  Chronic systolic heart failure: Currently on Toprol-XL, losartan, Aldactone.  Cardiac MRI pending.  3.  Suspected sleep apnea: Sleep study pending.  Case discussed with primary cardiology  Follow up with Dr. Elberta Fortis as usual post procedure  Signed, Emmalin Jaquess Jorja Loa, MD

## 2023-11-30 ENCOUNTER — Ambulatory Visit (HOSPITAL_COMMUNITY): Admit: 2023-11-30 | Payer: Self-pay | Admitting: Cardiology

## 2023-11-30 ENCOUNTER — Encounter (HOSPITAL_COMMUNITY): Payer: Self-pay

## 2023-11-30 DIAGNOSIS — I483 Typical atrial flutter: Secondary | ICD-10-CM

## 2023-11-30 SURGERY — CARDIOVERSION (CATH LAB)
Anesthesia: Monitor Anesthesia Care

## 2023-12-06 ENCOUNTER — Other Ambulatory Visit (HOSPITAL_COMMUNITY): Payer: Self-pay

## 2023-12-10 ENCOUNTER — Telehealth (HOSPITAL_COMMUNITY): Payer: Self-pay | Admitting: Cardiology

## 2023-12-10 NOTE — Telephone Encounter (Signed)
 Called to confirm/remind patient of their appointment at the [facility] on 12/10/23.   Appointment:   [] Confirmed  [x] Left mess   [] No answer/No voice mail  [] VM Full/unable to leave message  [] Phone not in service  Patient reminded to bring all medications and/or complete list.  Confirmed patient has transportation. Gave directions, instructed to utilize valet parking.

## 2023-12-13 ENCOUNTER — Telehealth (HOSPITAL_COMMUNITY): Payer: Self-pay

## 2023-12-13 ENCOUNTER — Ambulatory Visit (HOSPITAL_COMMUNITY)
Admission: RE | Admit: 2023-12-13 | Discharge: 2023-12-13 | Disposition: A | Discharge status: Home / Self Care | Payer: Self-pay | Source: Ambulatory Visit | Attending: Cardiology | Admitting: Cardiology

## 2023-12-13 ENCOUNTER — Other Ambulatory Visit (HOSPITAL_COMMUNITY): Payer: Self-pay

## 2023-12-13 VITALS — BP 118/78 | HR 70 | Wt 313.8 lb

## 2023-12-13 DIAGNOSIS — Z7901 Long term (current) use of anticoagulants: Secondary | ICD-10-CM | POA: Insufficient documentation

## 2023-12-13 DIAGNOSIS — I5022 Chronic systolic (congestive) heart failure: Secondary | ICD-10-CM | POA: Insufficient documentation

## 2023-12-13 DIAGNOSIS — Z7984 Long term (current) use of oral hypoglycemic drugs: Secondary | ICD-10-CM | POA: Insufficient documentation

## 2023-12-13 DIAGNOSIS — Z5971 Insufficient health insurance coverage: Secondary | ICD-10-CM | POA: Insufficient documentation

## 2023-12-13 DIAGNOSIS — I429 Cardiomyopathy, unspecified: Secondary | ICD-10-CM | POA: Insufficient documentation

## 2023-12-13 DIAGNOSIS — I483 Typical atrial flutter: Secondary | ICD-10-CM | POA: Insufficient documentation

## 2023-12-13 DIAGNOSIS — Z79899 Other long term (current) drug therapy: Secondary | ICD-10-CM | POA: Insufficient documentation

## 2023-12-13 LAB — COMPREHENSIVE METABOLIC PANEL WITH GFR
ALT: 31 U/L (ref 0–44)
AST: 24 U/L (ref 15–41)
Albumin: 3.7 g/dL (ref 3.5–5.0)
Alkaline Phosphatase: 65 U/L (ref 38–126)
Anion gap: 8 (ref 5–15)
BUN: 14 mg/dL (ref 6–20)
CO2: 28 mmol/L (ref 22–32)
Calcium: 9.1 mg/dL (ref 8.9–10.3)
Chloride: 103 mmol/L (ref 98–111)
Creatinine, Ser: 1.3 mg/dL — ABNORMAL HIGH (ref 0.61–1.24)
GFR, Estimated: >60 mL/min (ref >60)
Glucose, Bld: 104 mg/dL — ABNORMAL HIGH (ref 70–99)
Potassium: 4.2 mmol/L (ref 3.5–5.1)
Sodium: 139 mmol/L (ref 135–145)
Total Bilirubin: 0.3 mg/dL (ref 0.0–1.2)
Total Protein: 7.3 g/dL (ref 6.5–8.1)

## 2023-12-13 LAB — BRAIN NATRIURETIC PEPTIDE: B Natriuretic Peptide: 37.6 pg/mL (ref 0.0–100.0)

## 2023-12-13 LAB — TSH: TSH: 3.568 u[IU]/mL (ref 0.350–4.500)

## 2023-12-13 MED ORDER — METOPROLOL SUCCINATE ER 25 MG PO TB24: 75.0000 mg | ORAL_TABLET | Freq: Every day | ORAL | 3 refills | Status: AC

## 2023-12-13 MED ORDER — AMIODARONE HCL 200 MG PO TABS: 200.0000 mg | ORAL_TABLET | Freq: Two times a day (BID) | ORAL | Status: DC

## 2023-12-13 MED ORDER — SPIRONOLACTONE 25 MG PO TABS: 25.0000 mg | ORAL_TABLET | Freq: Every day | ORAL | 3 refills | Status: AC

## 2023-12-13 MED ORDER — JARDIANCE 10 MG PO TABS: 10.0000 mg | ORAL_TABLET | Freq: Every day | ORAL | 0 refills | Status: DC

## 2023-12-13 NOTE — Progress Notes (Signed)
 PCP: Patient, No Pcp Per HF Cardiology: Dr. Mitzie Anda   Chief complaint: atrial flutter, CHF  33 y.o. with history of atrial flutter and cardiomyopathy was referred by Dr. Avanell Bob for evaluation of CHF.  Patient had no known cardiac history prior to 3/25.  However, he has had some exertional symptoms for the last 2 years.  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  After that, he began to notice dyspnea and a sensation of his heart racing with heavier exertion such as playing basketball. For the last year, he has noted dyspnea carrying groceries up the stairs. He will also note his heart racing and mild chest tightness.  He did not see a doctor with these symptoms, just attributed it to weight gain.  About a week prior to his 3/25 admission, symptoms worsened.  He developed a fast heart rate that would not resolve (in the past, it would not last long).  He also reported orthopnea.  He developed chest tightness.  He went to the ER and was found to be in typical atrial flutter with RVR.  Troponin was mildly elevated without significant trend, thought to be demand ischemia.  Echo was done showing EF < 20%, global hypokinesis, mod-severe LV dilation, normal RV, mild MR. He was started on po amiodarone  and apixaban  and underwent TEE-guided DCCV back to NSR and was discharged home.  However, atrial flutter recurred shortly thereafter.   Patient returns for followup of CHF.  He remains in atrial flutter but rate is now controlled. He no longer feels palpitations.  He goes to the gym M/W/F and jogs on the treadmill.  He gets tired with this, but is doing better overall.  No exertional dyspnea with usual activities.  No orthopnea/PND.  No lightheadedness. Weight up 4 lbs.  He has seen Dr. Lawana Pray with plan for atrial flutter ablation, but waiting until he has insurance to cover the cost of ablation.   ECG (personally reviewed): Typical atrial flutter with rate 71  Labs (3/25):  Creatinine 1.88 => 1.3, LDL 101, HIV negative, HS-TnI 20 => 162, TSH normal, UDS +THC, Mg 1.9  PMH: 1. Atrial flutter: Typical.  DCCV to NSR in 3/25 but recurred by 11/25/23.  2. Chronic systolic CHF: Echo (3/25) with EF < 20%, global hypokinesis, mod-severe LV dilation, normal RV, mild MR.  - TEE (3/25): EF < 20%, moderate LV dilation, severe RV dysfunction, mild MR.   FH: Father's family with multiple people with atrial flutter/fibrillation. No history of cardiomyopathy that he knows.   SH: Lives in [place] with fiance, 2 children, works in roofing, no ETOH, no smoking but uses marijuana.  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   ROS: All systems reviewed and negative except as per HPI.   Current Outpatient Medications  Medication Sig Dispense Refill   acetaminophen  (TYLENOL ) 500 MG tablet Take 1,000 mg by mouth daily as needed for headache or moderate pain (pain score 4-6).     apixaban  (ELIQUIS ) 5 MG TABS tablet Take 1 tablet (5 mg total) by mouth 2 (two) times daily. 60 tablet 2   JARDIANCE  10 MG TABS tablet Take 1 tablet (10 mg total) by mouth daily before breakfast. 30 tablet 0   losartan  (COZAAR ) 25 MG tablet Take 0.5 tablets (12.5 mg total) by mouth daily. 90 tablet 0   amiodarone  (PACERONE ) 200 MG tablet Take 1 tablet (200 mg total) by mouth 2 (two) times daily.     metoprolol  succinate (TOPROL   XL) 25 MG 24 hr tablet Take 3 tablets (75 mg total) by mouth daily. Take with or immediately following a meal. 200 tablet 3   spironolactone  (ALDACTONE ) 25 MG tablet Take 1 tablet (25 mg total) by mouth daily. 90 tablet 3   No current facility-administered medications for this encounter.   BP 118/78   Pulse 70   Wt (!) 142.3 kg (313 lb 12.8 oz)   SpO2 97%   BMI 36.26 kg/m  General: NAD Neck: No JVD, no thyromegaly or thyroid nodule.  Lungs: Clear to auscultation bilaterally with normal respiratory effort. CV: Nondisplaced PMI.  Heart irregular S1/S2, no S3/S4, no murmur.  No  peripheral edema.  No carotid bruit.  Normal pedal pulses.  Abdomen: Soft, nontender, no hepatosplenomegaly, no distention.  Skin: Intact without lesions or rashes.  Neurologic: Alert and oriented x 3.  Psych: Normal affect. Extremities: No clubbing or cyanosis.  HEENT: Normal.   Assessment/Plan: 1. Atrial flutter: Typical.  Noted when he was admitted in 3/25.  Uncertain how long he had been in atrial flutter prior to admission.  He was started on po amiodarone  and apixaban  and underwent TEE-guided DCCV back to NSR, but he has reverted to atrial flutter.  He does not currently feel palpitations and HR is now controlled.  This makes me wonder whether the atrial flutter had been present for a relatively long time prior to last admission. He needs to get back into NSR given concern for tachycardia-mediated cardiomyopathy.  - Continue Eliquis .  - Decrease amiodarone  to 200 mg bid. Check LFTs and TSH today, he will need a regular eye exam while on amiodarone .  Hopefully can stop after ablation.  - Increase Toprol  XL to 75 mg daily.   - I have discussed the patient with Dr. Lawana Pray.  Will leave him in rate-controlled AFL for now, he is working on Museum/gallery curator.  When he has coverage, he will have AFL ablation with Dr. Lawana Pray.  2. Chronic systolic CHF: Echo in 3/25 with EF < 20%, global hypokinesis, mod-severe LV dilation, normal RV, mild MR.  Question in this situation is whether this is a tachycardia-mediated cardiomyopathy (he was in atrial flutter an unknown period of time prior to admission) or whether an underlying cardiomyopathy triggered the atrial flutter. To answer this question, we will need to get him back into NSR and then get an echo in a couple of months. He is not volume overloaded on exam.  NYHA class II symptoms.  - As above, he needs to get back into NSR.  Repeat echo at 2-3 months in NSR.  - Increase Toprol  XL to 75 mg daily.  - Continue losartan  12.5 daily, eventually transition  to Entresto.  - Increase spironolactone  to 25 mg daily, BMET today and again in 10 days.  - Start Farxiga 10 mg daily.  - Cardiac MRI to look for infiltrative disease scheduled in 5/25.  - If EF does not improve in NSR, he will need right/left heart catheterization in the future.  3. Suspect OSA: I will arrange for home sleep study when he has insurance.  4. SDOH: Social worker to talk with patient today to help expedite insurance coverage.  This is holding us  back from AFL ablation.    I spent 31 minutes reviewing records, interviewing/examining patient, and managing orders.   Peder Bourdon 12/13/2023

## 2023-12-13 NOTE — Patient Instructions (Addendum)
 INCREASE Toprol  XL to 75 mg daily.  INCREASE Spironolactone  to 25 mg daily.  CHANGE Amiodarone  to 200 mg Twice daily  START Jardiance  10 mg daily.  Labs done today, your results will be available in MyChart, we will contact you for abnormal readings.  Repeat blood work in 10 days.  Your physician recommends that you schedule a follow-up appointment in: 3 WEEKS  If you have any questions or concerns before your next appointment please send us  a message through [place] or call our office at [phone].    TO LEAVE A MESSAGE FOR THE NURSE SELECT OPTION 2, PLEASE LEAVE A MESSAGE INCLUDING: YOUR NAME DATE OF BIRTH CALL BACK NUMBER REASON FOR CALL**this is important as we prioritize the call backs  YOU WILL RECEIVE A CALL BACK THE SAME DAY AS LONG AS YOU CALL BEFORE 4:00 PM  At the [facility], you and your health needs are our priority. As part of our continuing mission to provide you with exceptional heart care, we have created designated Provider Care Teams. These Care Teams include your primary Cardiologist (physician) and Advanced Practice Providers (APPs- Physician Assistants and Nurse Practitioners) who all work together to provide you with the care you need, when you need it.   You may see any of the following providers on your designated Care Team at your next follow up: Dr Jules Oar Dr Peder Bourdon Dr. Alwin Baars Dr. Arta Lark Amy Marijane Shoulders, NP Ruddy Corral, [place] [facility] [place], [place] Dennise Fitz, NP [place] Lee, NP Shawnee Dellen, NP Luster Salters, PharmD Bevely Brush, PharmD   Please be sure to bring in all your medications bottles to every appointment.    Thank you for choosing  [facility]

## 2023-12-13 NOTE — Progress Notes (Signed)
 H&V Care Navigation CSW Progress Note  Clinical Social Worker consulted to speak with pt regarding lack of insurance coverage.  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  Pt reports his fiance helped to apply for medicaid for him- he makes between $2,500-4,000/month and is a household of one so informed he would likely be deemed to make too much to be approved for medicaid at this time.    Discussed CAFA as alternative to assist with [facility] bills- provided with application.  Discussed ACA insurance as potential option- may qualify for special enrollment due to starting this job about 1.5 months ago- encouraged him to call and inquire if this would qualify him to apply outside open enrollment.   SDOH Screenings   Food Insecurity: No Food Insecurity (11/18/2023)  Housing: Low Risk  (11/18/2023)  Transportation Needs: No Transportation Needs (11/18/2023)  Utilities: Not At Risk (11/18/2023)  Tobacco Use: Low Risk  (11/29/2023)    Will continue to follow and assist as needed  Elzada Pytel H. Darnelle Derrick, LCSW Clinical Social Worker [facility] Desk#: [phone] Cell#: [phone]

## 2023-12-13 NOTE — Telephone Encounter (Signed)
 Advanced Heart Failure Patient Advocate Encounter  Patient in office discussing SGLT2i use. I see no active pharmacy coverage for this patient at this time. Pt is in the process of applying for Medicaid and has been given 30 day trial card. I will be available for future medication assistance needs in the event pt is denied for Medicaid.  Kennis Peacock, CPhT Rx Patient Advocate Phone: [phone]

## 2023-12-15 ENCOUNTER — Other Ambulatory Visit (HOSPITAL_COMMUNITY): Payer: Self-pay | Admitting: Cardiology

## 2023-12-22 ENCOUNTER — Telehealth (HOSPITAL_COMMUNITY): Payer: Self-pay | Admitting: Licensed Clinical Social Worker

## 2023-12-22 NOTE — Telephone Encounter (Signed)
 CSW called pt to check in regarding progress with medicaid, CAFA, and ACA insurance options that we discussed during clinic visit.  Unable to reach- left VM requesting return call if he had any updates or questions regarding above.  Denton Flakes, LCSW Clinical Social Worker [facility] Desk#: [phone] Cell#: [phone]

## 2023-12-23 ENCOUNTER — Other Ambulatory Visit (HOSPITAL_COMMUNITY): Payer: Self-pay

## 2023-12-23 ENCOUNTER — Ambulatory Visit (HOSPITAL_COMMUNITY)
Admission: RE | Admit: 2023-12-23 | Discharge: 2023-12-23 | Disposition: A | Discharge status: Home / Self Care | Payer: Self-pay | Source: Ambulatory Visit | Attending: Cardiology | Admitting: Cardiology

## 2023-12-23 DIAGNOSIS — I5022 Chronic systolic (congestive) heart failure: Secondary | ICD-10-CM | POA: Insufficient documentation

## 2023-12-23 LAB — BASIC METABOLIC PANEL WITH GFR
Anion gap: 7 (ref 5–15)
BUN: 14 mg/dL (ref 6–20)
CO2: 25 mmol/L (ref 22–32)
Calcium: 9.4 mg/dL (ref 8.9–10.3)
Chloride: 107 mmol/L (ref 98–111)
Creatinine, Ser: 1.17 mg/dL (ref 0.61–1.24)
GFR, Estimated: >60 mL/min (ref >60)
Glucose, Bld: 100 mg/dL — ABNORMAL HIGH (ref 70–99)
Potassium: 4.4 mmol/L (ref 3.5–5.1)
Sodium: 139 mmol/L (ref 135–145)

## 2024-01-04 ENCOUNTER — Other Ambulatory Visit (HOSPITAL_COMMUNITY): Payer: Self-pay

## 2024-01-10 ENCOUNTER — Encounter (HOSPITAL_COMMUNITY): Payer: Self-pay | Admitting: Cardiology

## 2024-01-12 ENCOUNTER — Encounter (HOSPITAL_COMMUNITY): Payer: Self-pay

## 2024-01-13 ENCOUNTER — Ambulatory Visit (HOSPITAL_COMMUNITY): Payer: Self-pay | Attending: Cardiology

## 2024-02-10 ENCOUNTER — Telehealth: Payer: Self-pay | Admitting: *Deleted

## 2024-02-10 NOTE — Telephone Encounter (Addendum)
 Left message to call back to see if patient has obtained health insurance.  (?? Ablation)

## 2024-03-13 NOTE — Telephone Encounter (Signed)
 Left another message.

## 2024-03-14 ENCOUNTER — Telehealth (HOSPITAL_COMMUNITY): Payer: Self-pay | Admitting: *Deleted

## 2024-03-14 NOTE — Telephone Encounter (Signed)
 Attempted to call patient regarding upcoming cardiac MRI appointment. Left message on voicemail with name and callback number  Larey Brick RN Navigator Cardiac Imaging [facility] [phone] Office [phone] Cell

## 2024-03-15 ENCOUNTER — Ambulatory Visit (HOSPITAL_COMMUNITY): Admission: RE | Admit: 2024-03-15 | Payer: Self-pay | Source: Ambulatory Visit

## 2024-04-07 ENCOUNTER — Telehealth (HOSPITAL_COMMUNITY): Payer: Self-pay | Admitting: Cardiology

## 2024-04-07 NOTE — Telephone Encounter (Signed)
 Called to confirm/remind patient of their appointment at the [facility] on 04/07/24.   Appointment:   [x] Confirmed  [] Left mess   [] No answer/No voice mail  [] VM Full/unable to leave message  [] Phone not in service  Patient reminded to bring all medications and/or complete list.  Confirmed patient has transportation. Gave directions, instructed to utilize valet parking.

## 2024-04-10 ENCOUNTER — Other Ambulatory Visit (HOSPITAL_COMMUNITY): Payer: Self-pay

## 2024-04-10 ENCOUNTER — Inpatient Hospital Stay (HOSPITAL_COMMUNITY)
Admission: RE | Admit: 2024-04-10 | Discharge: 2024-04-10 | Discharge status: Home / Self Care | Payer: Self-pay | Source: Ambulatory Visit | Attending: Cardiology | Admitting: Cardiology

## 2024-04-10 ENCOUNTER — Telehealth (HOSPITAL_COMMUNITY): Payer: Self-pay

## 2024-04-10 ENCOUNTER — Ambulatory Visit (HOSPITAL_COMMUNITY): Payer: Self-pay | Admitting: Cardiology

## 2024-04-10 ENCOUNTER — Encounter (HOSPITAL_COMMUNITY): Payer: Self-pay | Admitting: Cardiology

## 2024-04-10 VITALS — BP 120/80 | HR 77 | Wt 314.2 lb

## 2024-04-10 DIAGNOSIS — Z7984 Long term (current) use of oral hypoglycemic drugs: Secondary | ICD-10-CM | POA: Insufficient documentation

## 2024-04-10 DIAGNOSIS — Z7901 Long term (current) use of anticoagulants: Secondary | ICD-10-CM | POA: Insufficient documentation

## 2024-04-10 DIAGNOSIS — Z79899 Other long term (current) drug therapy: Secondary | ICD-10-CM | POA: Insufficient documentation

## 2024-04-10 DIAGNOSIS — I5022 Chronic systolic (congestive) heart failure: Secondary | ICD-10-CM

## 2024-04-10 DIAGNOSIS — R7989 Other specified abnormal findings of blood chemistry: Secondary | ICD-10-CM | POA: Insufficient documentation

## 2024-04-10 DIAGNOSIS — I483 Typical atrial flutter: Secondary | ICD-10-CM

## 2024-04-10 LAB — COMPREHENSIVE METABOLIC PANEL WITH GFR
ALT: 26 U/L (ref 0–44)
AST: 25 U/L (ref 15–41)
Albumin: 3.6 g/dL (ref 3.5–5.0)
Alkaline Phosphatase: 68 U/L (ref 38–126)
Anion gap: 8 (ref 5–15)
BUN: 12 mg/dL (ref 6–20)
CO2: 27 mmol/L (ref 22–32)
Calcium: 9.3 mg/dL (ref 8.9–10.3)
Chloride: 106 mmol/L (ref 98–111)
Creatinine, Ser: 1.21 mg/dL (ref 0.61–1.24)
GFR, Estimated: >60 mL/min (ref >60)
Glucose, Bld: 101 mg/dL — ABNORMAL HIGH (ref 70–99)
Potassium: 4.2 mmol/L (ref 3.5–5.1)
Sodium: 141 mmol/L (ref 135–145)
Total Bilirubin: 0.6 mg/dL (ref 0.0–1.2)
Total Protein: 7.1 g/dL (ref 6.5–8.1)

## 2024-04-10 LAB — CBC
HCT: 47 % (ref 39.0–52.0)
Hemoglobin: 15.2 g/dL (ref 13.0–17.0)
MCH: 28.8 pg (ref 26.0–34.0)
MCHC: 32.3 g/dL (ref 30.0–36.0)
MCV: 89.2 fL (ref 80.0–100.0)
Platelets: 337 K/uL (ref 150–400)
RBC: 5.27 MIL/uL (ref 4.22–5.81)
RDW: 12.5 % (ref 11.5–15.5)
WBC: 6.1 K/uL (ref 4.0–10.5)
nRBC: 0 % (ref 0.0–0.2)

## 2024-04-10 LAB — TSH: TSH: 2.5 u[IU]/mL (ref 0.350–4.500)

## 2024-04-10 MED ORDER — APIXABAN 5 MG PO TABS: 5.0000 mg | ORAL_TABLET | Freq: Two times a day (BID) | ORAL | 6 refills | Status: AC

## 2024-04-10 MED ORDER — AMIODARONE HCL 200 MG PO TABS: 200.0000 mg | ORAL_TABLET | Freq: Every day | ORAL | 6 refills | Status: AC

## 2024-04-10 MED ORDER — JARDIANCE 10 MG PO TABS: 10.0000 mg | ORAL_TABLET | Freq: Every day | ORAL | 11 refills | Status: AC

## 2024-04-10 NOTE — Progress Notes (Signed)
 H&V Care Navigation CSW Progress Note  Clinical Social Worker consulted to meet with pt regarding lack of insurance.  Patient reports he lost his job about 1.5 weeks ago and that his income is now $1,200/month.  With this income could qualify for Medicaid pending reserve levels- informed pt of this and provided with flyer for [facility] [employer] workers- he will plan to go after visit to apply.  Patient believes he is over the reserve limit currently so might not qualify for awhile- is also planning on gaining new employment and has started applying.  Encouraged pt to check about insurance through employer.  Also provided with information about ACA and open enrollment coming up- encouraged to call as well to be screened for qualifying life event for special enrollment.  Provided CAFA to get assistance with outstanding and future cone bills while uninsured.   SDOH Screenings   Food Insecurity: No Food Insecurity (11/18/2023)  Housing: Low Risk  (11/18/2023)  Transportation Needs: No Transportation Needs (11/18/2023)  Utilities: Not At Risk (11/18/2023)  Financial Resource Strain: High Risk (04/10/2024)  Tobacco Use: Low Risk  (04/10/2024)   Andriette HILARIO Leech, LCSW Clinical Social Worker [facility] Desk#: [phone] Cell#: [phone]

## 2024-04-10 NOTE — Progress Notes (Signed)
 PCP: Patient, No Pcp Per HF Cardiology: Dr. Rolan   Chief complaint: atrial flutter, CHF  33 y.o. with history of atrial flutter and cardiomyopathy was referred by Dr. Okey for evaluation of CHF.  Patient had no known cardiac history prior to 3/25.  However, he has had some exertional symptoms for the last 2 years.  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  After that, he began to notice dyspnea and a sensation of his heart racing with heavier exertion such as playing basketball. For the last year, he has noted dyspnea carrying groceries up the stairs. He will also note his heart racing and mild chest tightness.  He did not see a doctor with these symptoms, just attributed it to weight gain.  About a week prior to his 3/25 admission, symptoms worsened.  He developed a fast heart rate that would not resolve (in the past, it would not last long).  He also reported orthopnea.  He developed chest tightness.  He went to the ER and was found to be in typical atrial flutter with RVR.  Troponin was mildly elevated without significant trend, thought to be demand ischemia.  Echo was done showing EF < 20%, global hypokinesis, mod-severe LV dilation, normal RV, mild MR. He was started on po amiodarone  and apixaban  and underwent TEE-guided DCCV back to NSR and was discharged home.  However, atrial flutter recurred shortly thereafter.   Patient returns for followup of CHF.  Weight is stable.  He lost his job with [employer] due to episodes of lightheadedness with heavy exertion (carrying a heavy load).  At some point, he has converted back to NSR and is in NSR today.  He is not sure when he converted but has felt better over the last week. He can walk on flat ground without dyspnea.  He can climb stairs to the 3rd floor where he lives without problems. No orthopnea/PND.  No chest pain.  No further lightheadedness.   ECG (personally reviewed): NSR, nonspecific T wave flattening  Labs  (3/25): Creatinine 1.88 => 1.3, LDL 101, HIV negative, HS-TnI 20 => 162, TSH normal, UDS +THC, Mg 1.9 Labs (5/25): K 4.4, creatinine 1.17  PMH: 1. Atrial flutter: Typical.  DCCV to NSR in 3/25 but recurred by 11/25/23.  2. Chronic systolic CHF: Echo (3/25) with EF < 20%, global hypokinesis, mod-severe LV dilation, normal RV, mild MR.  - TEE (3/25): EF < 20%, moderate LV dilation, severe RV dysfunction, mild MR.   FH: Father's family with multiple people with atrial flutter/fibrillation. No history of cardiomyopathy that he knows.   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   ROS: All systems reviewed and negative except as per HPI.   Current Outpatient Medications  Medication Sig Dispense Refill   acetaminophen  (TYLENOL ) 500 MG tablet Take 1,000 mg by mouth daily as needed for headache or moderate pain (pain score 4-6).     losartan  (COZAAR ) 25 MG tablet Take 0.5 tablets (12.5 mg total) by mouth daily. 90 tablet 0   metoprolol  succinate (TOPROL  XL) 25 MG 24 hr tablet Take 3 tablets (75 mg total) by mouth daily. Take with or immediately following a meal. 200 tablet 3   spironolactone  (ALDACTONE ) 25 MG tablet Take 1 tablet (25 mg total) by mouth daily. 90 tablet  3   amiodarone  (PACERONE ) 200 MG tablet Take 1 tablet (200 mg total) by mouth daily. 30 tablet 6   apixaban  (ELIQUIS ) 5 MG TABS tablet Take 1 tablet (5 mg total) by mouth 2 (two) times daily. 60 tablet 6   JARDIANCE  10 MG TABS tablet Take 1 tablet (10 mg total) by mouth daily before breakfast. 30 tablet 11   No current facility-administered medications for this encounter.   BP 120/80   Pulse 77   Wt (!) 142.5 kg (314 lb 3.2 oz)   SpO2 97%   BMI 36.31 kg/m  General: NAD Neck: No JVD, no thyromegaly or thyroid  nodule.  Lungs: Clear to auscultation bilaterally with normal respiratory  effort. CV: Nondisplaced PMI.  Heart regular S1/S2, no S3/S4, no murmur.  No peripheral edema.  No carotid bruit.  Normal pedal pulses.  Abdomen: Soft, nontender, no hepatosplenomegaly, no distention.  Skin: Intact without lesions or rashes.  Neurologic: Alert and oriented x 3.  Psych: Normal affect. Extremities: No clubbing or cyanosis.  HEENT: Normal.   Assessment/Plan: 1. Atrial flutter: Typical.  Noted when he was admitted in 3/25.  Uncertain how long he had been in atrial flutter prior to admission.  He was started on po amiodarone  and apixaban  and underwent TEE-guided DCCV back to NSR, but he reverted back to atrial flutter.  He has not felt palpitations.  This makes me wonder whether the atrial flutter had been present for a relatively long time prior to last admission. He needs to stay in NSR given concern for tachycardia-mediated cardiomyopathy.  Today, he is back in NSR on amiodarone .   - Continue Eliquis .  - Decrease amiodarone  to 200 mg daily. Check LFTs and TSH today, he will need a regular eye exam while on amiodarone .  Hopefully can stop after ablation.  - Increase Toprol  XL to 75 mg daily.   - I have discussed the patient with Dr. Inocencio.  When he is able to get insurance coverage, he will get AFL ablation.  2. Chronic systolic CHF: Echo in 3/25 with EF < 20%, global hypokinesis, mod-severe LV dilation, normal RV, mild MR.  Question in this situation is whether this is a tachycardia-mediated cardiomyopathy (he was in atrial flutter an unknown period of time prior to admission) or whether an underlying cardiomyopathy triggered the atrial flutter. To answer this question, we will need to get him back into NSR and then get an echo. He is not volume overloaded on exam.  NYHA class II symptoms.  He has gone back into NSR on amiodarone .  - I will arrange repeat echo.  If EF is still low while in NSR, will need cardiac cath.  - I would like him to get cardiac MRI to assess for infiltrative  disease, but this will not be an option until he gets insurance.  - Continue Toprol  XL 75 mg daily.  - Stop losartan , start Entresto 24/26 bid (he will need patient assistance). BMET/BNP today, BMET in 10 days.  - Continue spironolactone  25 mg daily.  - Continue Farxiga 10 mg daily.  3. Suspect OSA: I will arrange for home sleep study when he has insurance.  4. SDOH: Social worker to talk with patient today to help expedite insurance coverage.  This is holding us  back from AFL ablation.    I spent 31 minutes reviewing records, interviewing/examining patient, and managing orders.   Followup with HF pharmacist in 3 wks for med titration, see me in 2 months with echo.  Ezra Shuck 04/10/2024

## 2024-04-10 NOTE — Telephone Encounter (Signed)
 Advanced Heart Failure Patient Advocate Encounter  Application for Entresto faxed to [employer] on 04/10/2024. Application form attached to patient chart.  Rachel DEL, CPhT Rx Patient Advocate Phone: [phone]

## 2024-04-10 NOTE — Patient Instructions (Addendum)
 Medication Changes:  DECREASE Amiodarone  to 200 mg Daily  ONCE you get Entresto 24/26 mg from [employer] please start taking it Twice daily and STOP LOSARTAN   Lab Work:  Labs done today, your results will be available in MyChart, we will contact you for abnormal readings.   Testing/Procedures:  Your physician has requested that you have an echocardiogram. Echocardiography is a painless test that uses sound waves to create images of your heart. It provides your doctor with information about the size and shape of your heart and how well your heart's chambers and valves are working. This procedure takes approximately one hour. There are no restrictions for this procedure. Please do NOT wear cologne, perfume, aftershave, or lotions (deodorant is allowed). Please arrive 15 minutes prior to your appointment time.  Please note: We ask at that you not bring children with you during ultrasound (echo/ vascular) testing. Due to room size and safety concerns, children are not allowed in the ultrasound rooms during exams. Our front office staff cannot provide observation of children in our lobby area while testing is being conducted. An adult accompanying a patient to their appointment will only be allowed in the ultrasound room at the discretion of the ultrasound technician under special circumstances. We apologize for any inconvenience.  Special Instructions // Education:  Do the following things EVERYDAY: Weigh yourself in the morning before breakfast. Write it down and keep it in a log. Take your medicines as prescribed Eat low salt foods--Limit salt (sodium) to 2000 mg per day.  Stay as active as you can everyday Limit all fluids for the day to less than 2 liters   Follow-Up in:   Please follow up with our heart failure pharmacist in 3 weeks  Your physician recommends that you schedule a follow-up appointment in: 2 months with Dr Rolan   At the [facility], you and your health needs are our priority. We have a designated team specialized in the treatment of Heart Failure. This Care Team includes your primary Heart Failure Specialized Cardiologist (physician), Advanced Practice Providers (APPs- Physician Assistants and Nurse Practitioners), and Pharmacist who all work together to provide you with the care you need, when you need it.   You may see any of the following providers on your designated Care Team at your next follow up:  Dr. Toribio Fuel Dr. Ezra Rolan Dr. Ria Commander Dr. Odis Brownie Greig Mosses, NP Caffie Shed, [place] [facility] [place], [place] Beckey Coe, NP [place] Lee, NP Tinnie Redman, PharmD   Please be sure to bring in all your medications bottles to every appointment.   Need to Contact Us :  If you have any questions or concerns before your next appointment please send us  a message through [place] or call our office at [phone].    TO LEAVE A MESSAGE FOR THE NURSE SELECT OPTION 2, PLEASE LEAVE A MESSAGE INCLUDING: YOUR NAME DATE OF BIRTH CALL BACK NUMBER REASON FOR CALL**this is important as we prioritize the call backs  YOU WILL RECEIVE A CALL BACK THE SAME DAY AS LONG AS YOU CALL BEFORE 4:00 PM

## 2024-04-13 NOTE — Telephone Encounter (Signed)
 [employer] is requesting proof of income before making a determination for this patient. Left voicemail 04/13/24

## 2024-04-19 NOTE — Telephone Encounter (Signed)
 Second attempt to reach patient regarding proof of income for [employer]. No answer, left voicemail.

## 2024-04-26 NOTE — Telephone Encounter (Signed)
 Third attempt to contact patient regarding [employer] assistance. No answer, left voicemail. Will be available in the future if pt would like to pursue assistance.

## 2024-04-29 NOTE — Progress Notes (Incomplete)
 ***In Progress***    Advanced Heart Failure Clinic Note   HF Cardiologist: Dr. Rolan  HPI:  33 y.o. with history of atrial flutter and cardiomyopathy was referred by Dr. Okey for evaluation of CHF.  Patient had no known cardiac history prior to 10/2023.  However, he has had some exertional symptoms for the last 2 years.  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  After that, he began to notice dyspnea and a sensation of his heart racing with heavier exertion such as playing basketball. For the last year, he has noted dyspnea carrying groceries up the stairs. He will also note his heart racing and mild chest tightness.  He did not see a doctor with these symptoms, just attributed it to weight gain.  About a week prior to his 10/2023 admission, symptoms worsened.  He developed a fast heart rate that would not resolve (in the past, it would not last long).  He also reported orthopnea.  He developed chest tightness.  He went to the ER and was found to be in typical atrial flutter with RVR.  Troponin was mildly elevated without significant trend, thought to be demand ischemia.  Echo was done showing EF < 20%, global hypokinesis, mod-severe LV dilation, normal RV, mild MR. He was started on po amiodarone  and apixaban  and underwent TEE-guided DCCV back to NSR and was discharged home.  However, atrial flutter recurred shortly thereafter.    Patient returned for followup of CHF in 03/2024.SABRA  Weight was stable.  He lost his job with [employer] due to episodes of lightheadedness with heavy exertion (carrying a heavy load).  At some point, he had converted back to NSR and was in NSR at the last visit.  He was not sure when he converted but felt better. He could walk on flat ground without dyspnea and climb stairs to the 3rd floor where he lives without problems. No orthopnea/PND.  No chest pain.  No further lightheadedness.   Today he returns to HF clinic for pharmacist medication  titration. At last visit with MD, amiodarone  was decreased to 200 mg daily, metoprolol  succinate was increased to 75 mg daily, and losartan  was changed to Entresto 24/26 mg BID ***  Shortness of breath/dyspnea on exertion? {YES NO:22349}  Orthopnea/PND? {YES NO:22349} Edema? {YES NO:22349} Lightheadedness/dizziness? {YES NO:22349} Daily weights at home? {YES NO:22349} Blood pressure/heart rate monitoring at home? {YES [id number] Following low-sodium/fluid-restricted diet? {YES NO:22349}  HF Medications: Metoprolol  succinate 75 mg daily *** Entresto 24/25 mg BID *** vs Losartan  *** Spironolactone  25 mg daily *** Farxiga 10 mg daily ***  Has the patient been experiencing any side effects to the medications prescribed?  {YES NO:22349}  Does the patient have any problems obtaining medications due to transportation or finances?   {YES NO:22349}  Understanding of regimen: {excellent/good/fair/poor:19665} Understanding of indications: {excellent/good/fair/poor:19665} Potential of compliance: {excellent/good/fair/poor:19665} Patient understands to avoid NSAIDs. Patient understands to avoid decongestants.    Pertinent Lab Values: (04/10/24) Serum creatinine 1.21 mg/dL, BUN 12 mg/dL, Potassium 4.2 mmol/L, Sodium 141 mmol/L, BNP 37.6 (12/13/23)  Vital Signs: Weight: *** (last clinic weight: 314 lbs) Blood pressure: *** (last clinic BP: 120/80 mmHg) *** Heart rate: *** (last clinic HR: 77 bpm) ***  Assessment/Plan: 1. Atrial flutter: Typical. Noted when he was admitted in 10/2023. Uncertain how long he had been in atrial flutter prior to admission. He was started on po amiodarone  and apixaban  and underwent TEE-guided DCCV back to NSR, but he reverted  back to atrial flutter. Concern for atrial flutter being present for a relatively long time prior to last admission. 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 Today, he is back in NSR on amiodarone  *** - Continue Eliquis  5 mg BID *** -  Continue amiodarone  to 200 mg daily ***. Check LFTs and TSH today ***, he will need a regular eye exam while on amiodarone .  Hopefully can stop after ablation ***.  - Continue metoprolol  succinate to 75 mg daily ***.   - Dr. Rolan dicussed this patient with Dr. Inocencio. When he is able to get insurance coverage, he will get AFL ablation. ***  2. Chronic systolic CHF: Echo in 10/2023 with EF < 20%, global hypokinesis, mod-severe LV dilation, normal RV, mild MR.  Unclear if this is tachycardia-mediated cardiomyopathy (he was in atrial flutter an unknown period of time prior to admission) or whether an underlying cardiomyopathy triggered the atrial flutter. To answer this question, he will need to get him back into NSR and then get an echo. He is not volume overloaded on exam. *** NYHA class II symptoms.  He has gone back into NSR on amiodarone . *** - Repeat ECHO schedule for 04/2024.  If EF is still low while in NSR, will need cardiac cath.  - Per Dr. Rolan, he will need to get a cardiac MRI to assess for infiltrative disease, but this will not be an option until he gets insurance.  - Continue metoprolol  succinate 75 mg daily. *** - Continue Entresto 24/26 bid (he will need patient assistance). BMET/BNP today, BMET in 10 days. *** - Continue spironolactone  25 mg daily.  *** - Continue Farxiga 10 mg daily.  ***  3. Suspect OSA: Will need a home sleep study when he has insurance.   4. SDOH: Social worker met with patient during last visit in 03/2024. ***  Follow up ***  Morna Breach, PharmD PGY2 Cardiology Pharmacy Resident

## 2024-05-01 ENCOUNTER — Inpatient Hospital Stay (HOSPITAL_COMMUNITY)
Admission: RE | Admit: 2024-05-01 | Discharge: 2024-05-01 | Disposition: A | Discharge status: Home / Self Care | Payer: Self-pay | Source: Ambulatory Visit

## 2024-05-15 ENCOUNTER — Ambulatory Visit (HOSPITAL_COMMUNITY): Admission: RE | Admit: 2024-05-15 | Payer: Self-pay | Source: Ambulatory Visit

## 2024-05-26 NOTE — Telephone Encounter (Signed)
 Received communication from [employer] that the Entresto will no longer be available for patient assistance. Application will be disregarded.

## 2024-05-30 ENCOUNTER — Telehealth (HOSPITAL_COMMUNITY): Payer: Self-pay | Admitting: Cardiology

## 2024-05-30 NOTE — Telephone Encounter (Signed)
 Called to confirm/remind patient of their appointment at the [facility] on 05/30/2024.   Appointment:   [] Confirmed  [x] Left mess   [] No answer/No voice mail  [] VM Full/unable to leave message  [] Phone not in service  Patient reminded to bring all medications and/or complete list.  Confirmed patient has transportation. Gave directions, instructed to utilize valet parking.

## 2024-05-31 ENCOUNTER — Encounter (HOSPITAL_COMMUNITY): Payer: Self-pay | Admitting: Cardiology
